# Patient Record
Sex: Male | Born: 1965 | Race: Black or African American | Hispanic: No | Marital: Married | Smoking: Current every day smoker
Health system: Southern US, Community
[De-identification: ages and names within clinical notes are randomized; demographics above are authoritative.]

## PROBLEM LIST (undated history)

## (undated) DIAGNOSIS — F329 Major depressive disorder, single episode, unspecified: Secondary | ICD-10-CM

## (undated) DIAGNOSIS — R413 Other amnesia: Secondary | ICD-10-CM

## (undated) DIAGNOSIS — N529 Male erectile dysfunction, unspecified: Secondary | ICD-10-CM

## (undated) DIAGNOSIS — M549 Dorsalgia, unspecified: Secondary | ICD-10-CM

## (undated) DIAGNOSIS — G8929 Other chronic pain: Secondary | ICD-10-CM

## (undated) DIAGNOSIS — R945 Abnormal results of liver function studies: Secondary | ICD-10-CM

## (undated) DIAGNOSIS — R7989 Other specified abnormal findings of blood chemistry: Secondary | ICD-10-CM

## (undated) DIAGNOSIS — M1710 Unilateral primary osteoarthritis, unspecified knee: Secondary | ICD-10-CM

## (undated) DIAGNOSIS — S060XAA Concussion with loss of consciousness status unknown, initial encounter: Secondary | ICD-10-CM

## (undated) DIAGNOSIS — S060X9A Concussion with loss of consciousness of unspecified duration, initial encounter: Secondary | ICD-10-CM

## (undated) HISTORY — DX: Other chronic pain: G89.29

## (undated) HISTORY — DX: Concussion with loss of consciousness of unspecified duration, initial encounter: S06.0X9A

## (undated) HISTORY — DX: Major depressive disorder, single episode, unspecified: F32.9

## (undated) HISTORY — DX: Other amnesia: R41.3

## (undated) HISTORY — DX: Concussion with loss of consciousness status unknown, initial encounter: S06.0XAA

## (undated) HISTORY — PX: OTHER SURGICAL HISTORY: SHX169

## (undated) HISTORY — DX: Other specified abnormal findings of blood chemistry: R79.89

## (undated) HISTORY — DX: Abnormal results of liver function studies: R94.5

## (undated) HISTORY — DX: Unilateral primary osteoarthritis, unspecified knee: M17.10

## (undated) HISTORY — DX: Male erectile dysfunction, unspecified: N52.9

## (undated) HISTORY — DX: Dorsalgia, unspecified: M54.9

---

## 2004-02-04 ENCOUNTER — Emergency Department (HOSPITAL_COMMUNITY): Admission: EM | Admit: 2004-02-04 | Discharge: 2004-02-04 | Payer: Self-pay | Admitting: Emergency Medicine

## 2009-04-16 ENCOUNTER — Emergency Department (HOSPITAL_COMMUNITY): Admission: EM | Admit: 2009-04-16 | Discharge: 2009-04-16 | Payer: Self-pay | Admitting: Emergency Medicine

## 2009-10-16 ENCOUNTER — Emergency Department (HOSPITAL_COMMUNITY): Admission: EM | Admit: 2009-10-16 | Discharge: 2009-10-16 | Payer: Self-pay | Admitting: Emergency Medicine

## 2011-01-23 ENCOUNTER — Observation Stay (HOSPITAL_COMMUNITY)
Admission: EM | Admit: 2011-01-23 | Discharge: 2011-01-24 | Disposition: A | Discharge status: Home / Self Care | Payer: Self-pay | Attending: Emergency Medicine | Admitting: Emergency Medicine

## 2011-01-23 DIAGNOSIS — Y998 Other external cause status: Secondary | ICD-10-CM | POA: Insufficient documentation

## 2011-01-23 DIAGNOSIS — L02419 Cutaneous abscess of limb, unspecified: Principal | ICD-10-CM | POA: Insufficient documentation

## 2011-01-23 DIAGNOSIS — IMO0002 Reserved for concepts with insufficient information to code with codable children: Secondary | ICD-10-CM | POA: Insufficient documentation

## 2011-01-23 LAB — CBC
MCH: 29.5 pg (ref 26.0–34.0)
MCHC: 35.6 g/dL (ref 30.0–36.0)
Platelets: 190 10*3/uL (ref 150–400)
RDW: 13.9 % (ref 11.5–15.5)

## 2011-01-23 LAB — DIFFERENTIAL
Basophils Relative: 0 % (ref 0–1)
Eosinophils Absolute: 0.1 10*3/uL (ref 0.0–0.7)
Monocytes Absolute: 0.8 10*3/uL (ref 0.1–1.0)
Monocytes Relative: 9 % (ref 3–12)

## 2011-01-24 LAB — BASIC METABOLIC PANEL
Calcium: 8.6 mg/dL (ref 8.4–10.5)
Creatinine, Ser: 1.08 mg/dL (ref 0.4–1.5)
GFR calc Af Amer: >60 mL/min (ref >60)
GFR calc non Af Amer: >60 mL/min (ref >60)

## 2011-06-17 ENCOUNTER — Emergency Department (HOSPITAL_COMMUNITY)
Admission: EM | Admit: 2011-06-17 | Discharge: 2011-06-17 | Disposition: A | Discharge status: Home / Self Care | Payer: Self-pay | Attending: Emergency Medicine | Admitting: Emergency Medicine

## 2011-06-17 DIAGNOSIS — L298 Other pruritus: Secondary | ICD-10-CM | POA: Insufficient documentation

## 2011-06-17 DIAGNOSIS — L2989 Other pruritus: Secondary | ICD-10-CM | POA: Insufficient documentation

## 2011-06-17 DIAGNOSIS — R21 Rash and other nonspecific skin eruption: Secondary | ICD-10-CM | POA: Insufficient documentation

## 2011-06-17 DIAGNOSIS — T622X1A Toxic effect of other ingested (parts of) plant(s), accidental (unintentional), initial encounter: Secondary | ICD-10-CM | POA: Insufficient documentation

## 2011-06-17 DIAGNOSIS — L255 Unspecified contact dermatitis due to plants, except food: Secondary | ICD-10-CM | POA: Insufficient documentation

## 2012-01-14 ENCOUNTER — Emergency Department (HOSPITAL_COMMUNITY): Payer: 59

## 2012-01-14 ENCOUNTER — Encounter (HOSPITAL_COMMUNITY): Payer: Self-pay | Admitting: *Deleted

## 2012-01-14 ENCOUNTER — Observation Stay (HOSPITAL_COMMUNITY)
Admission: EM | Admit: 2012-01-14 | Discharge: 2012-01-15 | Disposition: A | Discharge status: Home / Self Care | Payer: 59 | Attending: Emergency Medicine | Admitting: Emergency Medicine

## 2012-01-14 DIAGNOSIS — R0602 Shortness of breath: Secondary | ICD-10-CM | POA: Insufficient documentation

## 2012-01-14 DIAGNOSIS — F17200 Nicotine dependence, unspecified, uncomplicated: Secondary | ICD-10-CM | POA: Insufficient documentation

## 2012-01-14 DIAGNOSIS — R079 Chest pain, unspecified: Principal | ICD-10-CM | POA: Insufficient documentation

## 2012-01-14 LAB — COMPREHENSIVE METABOLIC PANEL
ALT: 29 U/L (ref 0–53)
AST: 26 U/L (ref 0–37)
Albumin: 3.8 g/dL (ref 3.5–5.2)
CO2: 25 mEq/L (ref 19–32)
Calcium: 9.3 mg/dL (ref 8.4–10.5)
Chloride: 105 mEq/L (ref 96–112)
Creatinine, Ser: 1.1 mg/dL (ref 0.50–1.35)
Sodium: 138 mEq/L (ref 135–145)

## 2012-01-14 LAB — DIFFERENTIAL
Band Neutrophils: 0 % (ref 0–10)
Basophils Absolute: 0 10*3/uL (ref 0.0–0.1)
Basophils Relative: 0 % (ref 0–1)
Blasts: 0 %
Eosinophils Absolute: 0.2 K/uL (ref 0.0–0.7)
Eosinophils Relative: 2 % (ref 0–5)
Lymphocytes Relative: 57 % — ABNORMAL HIGH (ref 12–46)
Lymphs Abs: 4.7 10*3/uL — ABNORMAL HIGH (ref 0.7–4.0)
Metamyelocytes Relative: 0 %
Monocytes Absolute: 0.3 10*3/uL (ref 0.1–1.0)
Monocytes Relative: 4 % (ref 3–12)
Myelocytes: 0 %
Neutro Abs: 3 K/uL (ref 1.7–7.7)
Neutrophils Relative %: 37 % — ABNORMAL LOW (ref 43–77)
Promyelocytes Absolute: 0 %
nRBC: 0 /100{WBCs}

## 2012-01-14 LAB — CK TOTAL AND CKMB (NOT AT ARMC)
CK, MB: 1.7 ng/mL (ref 0.3–4.0)
Relative Index: 0.9 (ref 0.0–2.5)
Total CK: 180 U/L (ref 7–232)

## 2012-01-14 LAB — CBC
HCT: 44.1 % (ref 39.0–52.0)
Hemoglobin: 15.6 g/dL (ref 13.0–17.0)
MCH: 29.5 pg (ref 26.0–34.0)
MCHC: 35.4 g/dL (ref 30.0–36.0)
MCV: 83.4 fL (ref 78.0–100.0)
Platelets: 213 10*3/uL (ref 150–400)
RBC: 5.29 MIL/uL (ref 4.22–5.81)
RDW: 14 % (ref 11.5–15.5)
WBC: 8.2 10*3/uL (ref 4.0–10.5)

## 2012-01-14 LAB — TROPONIN I: Troponin I: <0.3 ng/mL (ref <0.30)

## 2012-01-14 LAB — COMPREHENSIVE METABOLIC PANEL WITH GFR
Alkaline Phosphatase: 48 U/L (ref 39–117)
BUN: 12 mg/dL (ref 6–23)
GFR calc Af Amer: >90 mL/min (ref >90)
GFR calc non Af Amer: 79 mL/min — ABNORMAL LOW (ref >90)
Glucose, Bld: 86 mg/dL (ref 70–99)
Potassium: 4.1 meq/L (ref 3.5–5.1)
Total Bilirubin: 0.4 mg/dL (ref 0.3–1.2)
Total Protein: 7 g/dL (ref 6.0–8.3)

## 2012-01-14 MED ORDER — ASPIRIN 325 MG PO TABS
325.0000 mg | ORAL_TABLET | ORAL | Status: AC
  Administered 2012-01-14: 325 mg via ORAL
  Filled 2012-01-14: qty 1

## 2012-01-14 MED ORDER — ACETAMINOPHEN 500 MG PO TABS
500.0000 mg | ORAL_TABLET | Freq: Four times a day (QID) | ORAL | Status: DC | PRN
  Filled 2012-01-14: qty 1

## 2012-01-14 MED ORDER — SODIUM CHLORIDE 0.9 % IV SOLN
1000.0000 mL | INTRAVENOUS | Status: DC
  Administered 2012-01-14: 1000 mL via INTRAVENOUS

## 2012-01-14 MED ORDER — ONDANSETRON HCL 4 MG/2ML IJ SOLN: 4.0000 mg | Freq: Four times a day (QID) | INTRAMUSCULAR | Status: DC | PRN

## 2012-01-14 NOTE — ED Notes (Signed)
The pt has a pain in his chest and lt underarm for 2 weeks intermittently.  He describes it as a cramp.  He had a 10 hour car trip 2 weeks ago and he started having leg pain and swelling bi-laterally .  None at present

## 2012-01-14 NOTE — ED Notes (Addendum)
Patient states he was sitting at his desk at work and started to feel SOB and left side chest pain radiating to left shoulder and intermittent abdominal pain and dizziness. Patient states he has swelling bilateral lower extremities on daily basis. Patient placed on monitor and sats of 98% RA. Family at bedside.

## 2012-01-14 NOTE — ED Provider Notes (Signed)
History     CSN: [id number]  Arrival date & time 01/14/12  1639   First MD Initiated Contact with Patient 01/14/12 1728      Chief Complaint  Patient presents with  . Chest Pain    (Consider location/radiation/quality/duration/timing/severity/associated sxs/prior treatment) Patient is a 46 y.o. male presenting with chest pain. The history is provided by the patient.  Chest Pain The chest pain began 1 - 2 weeks ago. Chest pain occurs intermittently. The chest pain is resolved. Associated with: unknown. At its most intense, the pain is at 8/10. The pain is currently at 0/10. The severity of the pain is moderate. The quality of the pain is described as sharp. Radiates to: radiates to left neck and left arm. Primary symptoms include shortness of breath (mild). Pertinent negatives for primary symptoms include no fever, no cough, no abdominal pain, no nausea and no vomiting.  Pertinent negatives for associated symptoms include no numbness. He tried nothing for the symptoms. Risk factors: smoker.     History reviewed. No pertinent past medical history.  History reviewed. No pertinent past surgical history.  No family history on file.  History  Substance Use Topics  . Smoking status: Current Everyday Smoker  . Smokeless tobacco: Not on file  . Alcohol Use: Yes      Review of Systems  Constitutional: Negative for fever.  HENT: Negative for rhinorrhea, drooling and neck pain.   Eyes: Negative for pain.  Respiratory: Positive for shortness of breath (mild). Negative for cough.   Cardiovascular: Positive for chest pain. Negative for leg swelling.  Gastrointestinal: Negative for nausea, vomiting, abdominal pain and diarrhea.  Genitourinary: Negative for dysuria and hematuria.  Musculoskeletal: Negative for gait problem.  Skin: Negative for color change.  Neurological: Negative for numbness and headaches.  Hematological: Negative for adenopathy.  Psychiatric/Behavioral: Negative  for behavioral problems.  All other systems reviewed and are negative.    Allergies  Review of patient's allergies indicates no known allergies.  Home Medications  No current outpatient prescriptions on file.  BP 132/89  Pulse 58  Temp(Src) 98.3 F (36.8 C) (Oral)  Resp 17  SpO2 100%  Physical Exam  Constitutional: He is oriented to person, place, and time. He appears well-developed and well-nourished.  HENT:  Head: Normocephalic and atraumatic.  Right Ear: External ear normal.  Left Ear: External ear normal.  Nose: Nose normal.  Mouth/Throat: Oropharynx is clear and moist. No oropharyngeal exudate.  Eyes: Conjunctivae and EOM are normal. Pupils are equal, round, and reactive to light.  Neck: Normal range of motion. Neck supple.  Cardiovascular: Normal rate, regular rhythm, normal heart sounds and intact distal pulses.  Exam reveals no gallop and no friction rub.   No murmur heard. Pulmonary/Chest: Effort normal and breath sounds normal. No respiratory distress. He has no wheezes.  Abdominal: Soft. Bowel sounds are normal. He exhibits no distension. There is no tenderness.  Musculoskeletal: Normal range of motion. He exhibits no edema and no tenderness.  Neurological: He is alert and oriented to person, place, and time.  Skin: Skin is warm and dry.  Psychiatric: He has a normal mood and affect. His behavior is normal.    ED Course  Procedures (including critical care time)  Labs Reviewed  DIFFERENTIAL - Abnormal; Notable for the following:    Neutrophils Relative 37 (*)    Lymphocytes Relative 57 (*)    Lymphs Abs 4.7 (*)    All other components within normal limits  COMPREHENSIVE METABOLIC PANEL -  Abnormal; Notable for the following:    GFR calc non Af Amer 79 (*)    All other components within normal limits  CBC  CK TOTAL AND CKMB  TROPONIN I   Dg Chest 2 View  01/14/2012  *RADIOLOGY REPORT*  Clinical Data: Left-sided chest pain.  Dizziness.  CHEST - 2 VIEW   Comparison: None.  Findings: Cardiomediastinal silhouette unremarkable.   Lungs clear. Bronchovascular markings normal.  Pulmonary vascularity normal.  No pneumothorax.  No pleural effusions.  Mild degenerative changes involving the thoracic spine.  IMPRESSION: No acute cardiopulmonary disease.  Original Report Authenticated By: Arnell Sieving, M.D.     1. Chest pain      Date: 01/14/2012  Rate: 62  Rhythm: normal sinus rhythm  QRS Axis: normal  Intervals: normal  ST/T Wave abnormalities: nonspecific T wave changes  Conduction Disutrbances:none  Narrative Interpretation: Flipped T waves in III and aVF  Old EKG Reviewed: none available    MDM  7:14 PM 46 y.o. male pw intermittent cp x 2 weeks, worse today. Pt notes left sided cp while at rest w/ assoc sx of mild sob and dizziness today. Pt AFVSS here, appears well on exam, denies cp now. Pt is timi 0. Labs non-contrib. Pt got ASA.  Will admit to chest pain obs and get coronary CT in the am.   Clinical Impression 1. Chest pain            Purvis Sheffield, MD 01/15/12 0003

## 2012-01-15 ENCOUNTER — Observation Stay (HOSPITAL_COMMUNITY)
Admit: 2012-01-15 | Discharge: 2012-01-15 | Disposition: A | Discharge status: Home / Self Care | Payer: 59 | Attending: Emergency Medicine | Admitting: Emergency Medicine

## 2012-01-15 LAB — POCT I-STAT TROPONIN I
Troponin i, poc: 0 ng/mL (ref 0.00–0.08)
Troponin i, poc: 0 ng/mL (ref 0.00–0.08)
Troponin i, poc: 0 ng/mL (ref 0.00–0.08)

## 2012-01-15 MED ORDER — NITROGLYCERIN 0.4 MG SL SUBL
SUBLINGUAL_TABLET | SUBLINGUAL | Status: AC
  Filled 2012-01-15: qty 25

## 2012-01-15 MED ORDER — METOPROLOL TARTRATE 25 MG PO TABS
50.0000 mg | ORAL_TABLET | Freq: Once | ORAL | Status: AC
  Administered 2012-01-15: 50 mg via ORAL
  Filled 2012-01-15: qty 2

## 2012-01-15 MED ORDER — METOPROLOL TARTRATE 1 MG/ML IV SOLN
5.0000 mg | Freq: Once | INTRAVENOUS | Status: AC
  Administered 2012-01-15: 5 mg via INTRAVENOUS

## 2012-01-15 MED ORDER — METOPROLOL TARTRATE 1 MG/ML IV SOLN
5.0000 mg | Freq: Once | INTRAVENOUS | Status: AC
  Administered 2012-01-15: 5 mg via INTRAVENOUS
  Filled 2012-01-15: qty 15

## 2012-01-15 MED ORDER — NITROGLYCERIN 0.4 MG SL SUBL
0.4000 mg | SUBLINGUAL_TABLET | Freq: Once | SUBLINGUAL | Status: AC
  Administered 2012-01-15: 0.4 mg via SUBLINGUAL

## 2012-01-15 MED ORDER — IOHEXOL 350 MG/ML SOLN
80.0000 mL | Freq: Once | INTRAVENOUS | Status: AC | PRN
  Administered 2012-01-15: 80 mL via INTRAVENOUS

## 2012-01-15 NOTE — ED Notes (Signed)
Breakfast tray to pt, he has discharge papers but wants to eat before going home

## 2012-01-15 NOTE — ED Provider Notes (Signed)
I saw and evaluated the patient, reviewed the resident's note and I agree with the findings and plan.   I agree with his ECG interpretation.  Pt with intermittent chest pressure, pt is smoker.  No pain now.  Will get CDU coronary CT scan.  Pt with non specific T wave inversions inferiorly.    Gavin Pound. Jalynn Betzold, MD 01/15/12 0005

## 2012-01-15 NOTE — ED Notes (Signed)
Dr Judd Lien in to speak with pt about results.

## 2012-01-15 NOTE — ED Notes (Signed)
BMI: 32.6 

## 2012-01-15 NOTE — ED Notes (Signed)
Pt given grape juice 

## 2012-01-15 NOTE — ED Provider Notes (Signed)
Care assumed at shift change.  The coronary ct is okay.  Will discharge the patient to home.  To follow up with pcp in the next week, return prn.  Geoffery Lyons, MD 01/15/12 1031

## 2012-01-15 NOTE — Discharge Instructions (Signed)
Chest Pain (Nonspecific) It is often hard to give a specific diagnosis for the cause of chest pain. There is always a chance that your pain could be related to something serious, such as a heart attack or a blood clot in the lungs. You need to follow up with your caregiver for further evaluation. CAUSES   Heartburn.   Pneumonia or bronchitis.   Anxiety or stress.   Inflammation around your heart (pericarditis) or lung (pleuritis or pleurisy).   A blood clot in the lung.   A collapsed lung (pneumothorax). It can develop suddenly on its own (spontaneous pneumothorax) or from injury (trauma) to the chest.   Shingles infection (herpes zoster virus).  The chest wall is composed of bones, muscles, and cartilage. Any of these can be the source of the pain.  The bones can be bruised by injury.   The muscles or cartilage can be strained by coughing or overwork.   The cartilage can be affected by inflammation and become sore (costochondritis).  DIAGNOSIS  Lab tests or other studies, such as X-rays, electrocardiography, stress testing, or cardiac imaging, may be needed to find the cause of your pain.  TREATMENT   Treatment depends on what may be causing your chest pain. Treatment may include:   Acid blockers for heartburn.   Anti-inflammatory medicine.   Pain medicine for inflammatory conditions.   Antibiotics if an infection is present.   You may be advised to change lifestyle habits. This includes stopping smoking and avoiding alcohol, caffeine, and chocolate.   You may be advised to keep your head raised (elevated) when sleeping. This reduces the chance of acid going backward from your stomach into your esophagus.   Most of the time, nonspecific chest pain will improve within 2 to 3 days with rest and mild pain medicine.  HOME CARE INSTRUCTIONS   If antibiotics were prescribed, take your antibiotics as directed. Finish them even if you start to feel better.   For the next few  days, avoid physical activities that bring on chest pain. Continue physical activities as directed.   Do not smoke.   Avoid drinking alcohol.   Only take over-the-counter or prescription medicine for pain, discomfort, or fever as directed by your caregiver.   Follow your caregiver's suggestions for further testing if your chest pain does not go away.   Keep any follow-up appointments you made. If you do not go to an appointment, you could develop lasting (chronic) problems with pain. If there is any problem keeping an appointment, you must call to reschedule.  SEEK MEDICAL CARE IF:   You think you are having problems from the medicine you are taking. Read your medicine instructions carefully.   Your chest pain does not go away, even after treatment.   You develop a rash with blisters on your chest.  SEEK IMMEDIATE MEDICAL CARE IF:   You have increased chest pain or pain that spreads to your arm, neck, jaw, back, or abdomen.   You develop shortness of breath, an increasing cough, or you are coughing up blood.   You have severe back or abdominal pain, feel nauseous, or vomit.   You develop severe weakness, fainting, or chills.   You have a fever.  THIS IS AN EMERGENCY. Do not wait to see if the pain will go away. Get medical help at once. Call your local emergency services (911 in [place] Do not drive yourself to the hospital. MAKE SURE YOU:   Understand these instructions.     Will watch your condition.   Will get help right away if you are not doing well or get worse.  Document Released: 05/20/2005 Document Revised: 07/30/2011 Document Reviewed: 03/15/2008 Exit[employer] Patient Information 2012 [employer]. 

## 2012-02-12 ENCOUNTER — Encounter (HOSPITAL_COMMUNITY): Payer: Self-pay | Admitting: *Deleted

## 2012-02-12 ENCOUNTER — Emergency Department (HOSPITAL_COMMUNITY)
Admission: EM | Admit: 2012-02-12 | Discharge: 2012-02-12 | Disposition: A | Discharge status: Home / Self Care | Payer: 59 | Attending: Emergency Medicine | Admitting: Emergency Medicine

## 2012-02-12 DIAGNOSIS — R55 Syncope and collapse: Secondary | ICD-10-CM | POA: Insufficient documentation

## 2012-02-12 DIAGNOSIS — R42 Dizziness and giddiness: Secondary | ICD-10-CM | POA: Insufficient documentation

## 2012-02-12 LAB — DIFFERENTIAL
Basophils Relative: 1 % (ref 0–1)
Eosinophils Absolute: 0.1 10*3/uL (ref 0.0–0.7)
Eosinophils Relative: 1 % (ref 0–5)
Neutrophils Relative %: 43 % (ref 43–77)

## 2012-02-12 LAB — COMPREHENSIVE METABOLIC PANEL
ALT: 32 U/L (ref 0–53)
AST: 25 U/L (ref 0–37)
Albumin: 3.8 g/dL (ref 3.5–5.2)
Alkaline Phosphatase: 54 U/L (ref 39–117)
BUN: 13 mg/dL (ref 6–23)
CO2: 27 mEq/L (ref 19–32)
Calcium: 9.4 mg/dL (ref 8.4–10.5)
Chloride: 102 mEq/L (ref 96–112)
Creatinine, Ser: 1.19 mg/dL (ref 0.50–1.35)
GFR calc Af Amer: 83 mL/min — ABNORMAL LOW (ref >90)
GFR calc non Af Amer: 72 mL/min — ABNORMAL LOW (ref >90)
Glucose, Bld: 113 mg/dL — ABNORMAL HIGH (ref 70–99)
Potassium: 4.1 mEq/L (ref 3.5–5.1)
Sodium: 137 mEq/L (ref 135–145)
Total Bilirubin: 0.5 mg/dL (ref 0.3–1.2)
Total Protein: 7.1 g/dL (ref 6.0–8.3)

## 2012-02-12 LAB — CBC
HCT: 43.5 % (ref 39.0–52.0)
Hemoglobin: 15.2 g/dL (ref 13.0–17.0)
MCH: 29.2 pg (ref 26.0–34.0)
MCHC: 34.9 g/dL (ref 30.0–36.0)
MCV: 83.7 fL (ref 78.0–100.0)
Platelets: 181 10*3/uL (ref 150–400)
RBC: 5.2 MIL/uL (ref 4.22–5.81)
RDW: 13.6 % (ref 11.5–15.5)
WBC: 7 10*3/uL (ref 4.0–10.5)

## 2012-02-12 MED ORDER — HYDROCODONE-ACETAMINOPHEN 5-500 MG PO TABS: 1.0000 | ORAL_TABLET | Freq: Four times a day (QID) | ORAL | Status: AC | PRN

## 2012-02-12 MED ORDER — SODIUM CHLORIDE 0.9 % IV SOLN
Freq: Once | INTRAVENOUS | Status: AC
  Administered 2012-02-12: 13:00:00 via INTRAVENOUS

## 2012-02-12 MED ORDER — MECLIZINE HCL 25 MG PO TABS: 25.0000 mg | ORAL_TABLET | Freq: Three times a day (TID) | ORAL | Status: AC | PRN

## 2012-02-12 MED ORDER — HYDROCODONE-ACETAMINOPHEN 5-325 MG PO TABS
1.0000 | ORAL_TABLET | Freq: Once | ORAL | Status: AC
  Administered 2012-02-12: 1 via ORAL
  Filled 2012-02-12: qty 1

## 2012-02-12 NOTE — Discharge Instructions (Signed)

## 2012-02-12 NOTE — ED Notes (Signed)
AVW:[id number]<XB> Expected date:<BR> Expected time:11:43 AM<BR> Means of arrival:<BR> Comments:<BR> M65 - 45yoM Near syncope, SOB

## 2012-02-12 NOTE — ED Notes (Signed)
Per EMS while at work pt started feeling dizzy, stood up and felt like he was about to pass out. Pt was diaphoretic on EMS arrival. Pt has hx of same and was evaluated at [facility] for same and told troponin was elevated. Pt denies complaints of pain.

## 2012-02-12 NOTE — ED Provider Notes (Addendum)
History     CSN: [id number]  Arrival date & time 02/12/12  1140   First MD Initiated Contact with Patient 02/12/12 1149      Chief Complaint  Patient presents with  . Near Syncope    (Consider location/radiation/quality/duration/timing/severity/associated sxs/prior treatment) HPI Comments: Was at work at [employer].  Bent over and stood up, then became dizzy and felt like was going to pass out.  Episode lasted for about 15 minutes, now seems to be improving.  Was recently on CDU at [facility] and had a normal cardiac ct.  Patient is a 46 y.o. male presenting with weakness. The history is provided by the patient.  Weakness The primary symptoms include dizziness. Primary symptoms do not include headaches. The symptoms began less than 1 hour ago. The episode lasted 15 minutes. The symptoms are improving. The neurological symptoms are diffuse. The symptoms occurred after standing up.  Dizziness also occurs with weakness.  Additional symptoms include weakness. Additional symptoms do not include pain. Medical issues do not include seizures or cerebral vascular accident. Procedure history comments: cardiac ct negative.    History reviewed. No pertinent past medical history.  History reviewed. No pertinent past surgical history.  History reviewed. No pertinent family history.  History  Substance Use Topics  . Smoking status: Current Everyday Smoker  . Smokeless tobacco: Not on file  . Alcohol Use: Yes      Review of Systems  Neurological: Positive for dizziness and weakness. Negative for headaches.    Allergies  Review of patient's allergies indicates no known allergies.  Home Medications   Current Outpatient Rx  Name Route Sig Dispense Refill  . HYDROCODONE-ACETAMINOPHEN 5-500 MG PO TABS Oral Take 2 tablets by mouth every 6 (six) hours as needed. Pain      BP 127/93  Pulse 57  Temp 98.6 F (37 C) (Oral)  Resp 12  SpO2 98%  Physical Exam  Nursing note and vitals  reviewed. Constitutional: He is oriented to person, place, and time. He appears well-developed and well-nourished. No distress.  HENT:  Head: Normocephalic and atraumatic.  Mouth/Throat: Oropharynx is clear and moist.  Eyes: EOM are normal. Pupils are equal, round, and reactive to light.  Neck: Normal range of motion. Neck supple.  Cardiovascular: Normal rate and regular rhythm.   No murmur heard. Pulmonary/Chest: Breath sounds normal. No respiratory distress. He has no wheezes.  Abdominal: Soft. Bowel sounds are normal. He exhibits no distension. There is no tenderness.  Musculoskeletal: Normal range of motion. He exhibits no edema.  Lymphadenopathy:    He has no cervical adenopathy.  Neurological: He is alert and oriented to person, place, and time. No cranial nerve deficit. He exhibits normal muscle tone. Coordination normal.  Skin: Skin is warm and dry. He is not diaphoretic.    ED Course  Procedures (including critical care time)  Labs Reviewed - No data to display No results found.   No diagnosis found.   Date: 02/12/2012  Rate: 63  Rhythm: normal sinus rhythm  QRS Axis: normal  Intervals: normal  ST/T Wave abnormalities: normal  Conduction Disutrbances:none  Narrative Interpretation:   Old EKG Reviewed: unchanged    MDM  The patient presents with dizziness that began at work after bending over, then standing up.  It has since resolved and he is now asymptomatic.  The workup today looks okay and cardiac ct last month makes a cardiac etiology unlikely.  He will be discharged to home.  Geoffery Lyons, MD 02/12/12 1448  Geoffery Lyons, MD 03/09/12 1610

## 2012-05-22 ENCOUNTER — Encounter: Payer: Self-pay | Admitting: Internal Medicine

## 2012-05-22 DIAGNOSIS — Z0000 Encounter for general adult medical examination without abnormal findings: Secondary | ICD-10-CM | POA: Insufficient documentation

## 2012-05-24 ENCOUNTER — Ambulatory Visit (INDEPENDENT_AMBULATORY_CARE_PROVIDER_SITE_OTHER): Payer: 59 | Admitting: Internal Medicine

## 2012-05-24 ENCOUNTER — Other Ambulatory Visit (INDEPENDENT_AMBULATORY_CARE_PROVIDER_SITE_OTHER): Payer: 59

## 2012-05-24 ENCOUNTER — Encounter: Payer: Self-pay | Admitting: Internal Medicine

## 2012-05-24 VITALS — BP 110/70 | HR 76 | Temp 98.5°F | Ht 76.0 in | Wt 272.5 lb

## 2012-05-24 DIAGNOSIS — F32A Depression, unspecified: Secondary | ICD-10-CM

## 2012-05-24 DIAGNOSIS — Z0000 Encounter for general adult medical examination without abnormal findings: Secondary | ICD-10-CM

## 2012-05-24 DIAGNOSIS — M179 Osteoarthritis of knee, unspecified: Secondary | ICD-10-CM

## 2012-05-24 DIAGNOSIS — H538 Other visual disturbances: Secondary | ICD-10-CM

## 2012-05-24 DIAGNOSIS — F329 Major depressive disorder, single episode, unspecified: Secondary | ICD-10-CM

## 2012-05-24 DIAGNOSIS — Z23 Encounter for immunization: Secondary | ICD-10-CM

## 2012-05-24 DIAGNOSIS — M1710 Unilateral primary osteoarthritis, unspecified knee: Secondary | ICD-10-CM

## 2012-05-24 DIAGNOSIS — N529 Male erectile dysfunction, unspecified: Secondary | ICD-10-CM

## 2012-05-24 HISTORY — DX: Osteoarthritis of knee, unspecified: M17.9

## 2012-05-24 HISTORY — DX: Depression, unspecified: F32.A

## 2012-05-24 HISTORY — DX: Unilateral primary osteoarthritis, unspecified knee: M17.10

## 2012-05-24 HISTORY — DX: Male erectile dysfunction, unspecified: N52.9

## 2012-05-24 LAB — BASIC METABOLIC PANEL
Calcium: 9 mg/dL (ref 8.4–10.5)
Creatinine, Ser: 1.2 mg/dL (ref 0.4–1.5)
GFR: 87.76 mL/min (ref >60.00)
Glucose, Bld: 92 mg/dL (ref 70–99)
Sodium: 138 mEq/L (ref 135–145)

## 2012-05-24 LAB — HEPATIC FUNCTION PANEL
Albumin: 3.7 g/dL (ref 3.5–5.2)
Alkaline Phosphatase: 48 U/L (ref 39–117)
Bilirubin, Direct: 0.1 mg/dL (ref 0.0–0.3)
Total Protein: 6.5 g/dL (ref 6.0–8.3)

## 2012-05-24 LAB — CBC WITH DIFFERENTIAL/PLATELET
Basophils Absolute: 0 10*3/uL (ref 0.0–0.1)
Basophils Relative: 0.3 % (ref 0.0–3.0)
HCT: 48.2 % (ref 39.0–52.0)
Hemoglobin: 15.9 g/dL (ref 13.0–17.0)
Lymphocytes Relative: 42.4 % (ref 12.0–46.0)
Lymphs Abs: 3 10*3/uL (ref 0.7–4.0)
Monocytes Relative: 8.4 % (ref 3.0–12.0)
Neutro Abs: 3.4 10*3/uL (ref 1.4–7.7)
RBC: 5.51 Mil/uL (ref 4.22–5.81)
RDW: 14.2 % (ref 11.5–14.6)

## 2012-05-24 LAB — PSA: PSA: 0.53 ng/mL (ref 0.10–4.00)

## 2012-05-24 LAB — URINALYSIS, ROUTINE W REFLEX MICROSCOPIC
Bilirubin Urine: NEGATIVE
Hgb urine dipstick: NEGATIVE
Ketones, ur: NEGATIVE
Nitrite: NEGATIVE
Total Protein, Urine: NEGATIVE
Urine Glucose: NEGATIVE
pH: 6 (ref 5.0–8.0)

## 2012-05-24 LAB — LDL CHOLESTEROL, DIRECT: Direct LDL: 121.7 mg/dL

## 2012-05-24 LAB — LIPID PANEL
Cholesterol: 200 mg/dL (ref 0–200)
HDL: 34.2 mg/dL — ABNORMAL LOW (ref >39.00)
Total CHOL/HDL Ratio: 6
Triglycerides: 249 mg/dL — ABNORMAL HIGH (ref 0.0–149.0)

## 2012-05-24 MED ORDER — DULOXETINE HCL 60 MG PO CPEP: 60.0000 mg | ORAL_CAPSULE | Freq: Every day | ORAL | Status: DC

## 2012-05-24 MED ORDER — HYDROCODONE-ACETAMINOPHEN 7.5-500 MG PO TABS: 1.0000 | ORAL_TABLET | Freq: Four times a day (QID) | ORAL | Status: DC | PRN

## 2012-05-24 MED ORDER — SILDENAFIL CITRATE 100 MG PO TABS: 100.0000 mg | ORAL_TABLET | Freq: Every day | ORAL | Status: DC | PRN

## 2012-05-24 MED ORDER — CYCLOBENZAPRINE HCL 5 MG PO TABS: 5.0000 mg | ORAL_TABLET | Freq: Three times a day (TID) | ORAL | Status: AC | PRN

## 2012-05-24 NOTE — Assessment & Plan Note (Addendum)
bilat knees, chronic pain, bone on bone per pt left > right, on pain med for 10 yrs, pt has been considering surgury for 4 yrs and now would like referral

## 2012-05-24 NOTE — Assessment & Plan Note (Signed)
Ok for viagra asd prn,  to f/u any worsening symptoms or concerns , for testosterone level

## 2012-05-24 NOTE — Patient Instructions (Addendum)
You had the flu shot today Your EKG was ok today Please go to LAB in the Basement for the blood and/or urine tests to be done today You will be contacted by phone if any changes need to be made immediately.  Otherwise, you will receive a letter about your results with an explanation. Please remember to sign up for My Chart at your earliest convenience, as this will be important to you in the future with finding out test results. Take all new medications as prescribed - the Cymbalta for depression and pain (it is started at 30 mg per day for 1 wk, then 60 mg per day after that); as well as the increased strength of the pain medication, and the muscle relaxer as needed Continue all other medications as before - including the viagra (with the coupon today) You will be contacted regarding the referral for: orthopedic, and opthomology

## 2012-05-24 NOTE — Assessment & Plan Note (Signed)
With ? Retinal problem previously - for optho referral

## 2012-05-24 NOTE — Assessment & Plan Note (Signed)

## 2012-05-24 NOTE — Progress Notes (Signed)
Subjective:    Patient ID: Ryan Zimmerman, male    DOB: 17-Dec-1965, 46 y.o.   MRN: [id number]  HPI  Here for wellness and f/u;  Overall doing ok;  Pt denies CP, worsening SOB, DOE, wheezing, orthopnea, PND, worsening LE edema, palpitations, dizziness or syncope.  Pt denies neurological change such as new Headache, facial or extremity weakness.  Pt denies polydipsia, polyuria, or low sugar symptoms. Pt states overall good compliance with treatment and medications, good tolerability, and trying to follow lower cholesterol diet. No fever, wt loss, night sweats, loss of appetite, or other constitutional symptoms.  Pt states good ability with ADL's, low fall risk, home safety reviewed and adequate, no significant changes in hearing or vision, and occasionally active with exercise.  Has end stage OA knees per pt, pain now too severe and would like referral to ortho.  Also occasional mid and upper abd discomfort/uncomfortable, mild to mod, 3 yrs, no n/v except with some certain foods and no vomit except for rare gag.  Denies constipatoin and  Denies urinary symptoms such as dysuria, frequency, urgency,or hematuria.   Pt denies fever, wt loss, night sweats, loss of appetite, or other constitutional symptoms . Has had some worsening ED issue in past 1 yr , wife concerned, viagra helped last yr.  Has been more depressed with pain , less active, less social lately, Denies suicidal ideation, or panic.   He would try sample cymbalta if helps with pain, as wife has been pushing for him to try something and he has been reluctant to date. Past Medical History  Diagnosis Date  . Osteoarthritis of knee 05/24/2012  . Depression 05/24/2012  . Erectile dysfunction 05/24/2012   History reviewed. No pertinent past surgical history.  reports that he has quit smoking. He has never used smokeless tobacco. He reports that he drinks alcohol. He reports that he does not use illicit drugs. family history includes Arthritis in his other;  Cancer in his mother; and Hypertension in his other. Allergies  Allergen Reactions  . Percocet (Oxycodone-Acetaminophen) Itching   No current outpatient prescriptions on file prior to visit.   Review of Systems Review of Systems  Constitutional: Negative for diaphoresis, activity change, appetite change and unexpected weight change.  HENT: Negative for hearing loss, ear pain, facial swelling, mouth sores and neck stiffness.  but has recent worsening blurry vision, remembers a concern yrs ago about what sounds like retinal swelling related issue;  Also reading glasses not working as well for [employer] he does every day Eyes: Negative for pain, redness and visual disturbance.  Respiratory: Negative for shortness of breath and wheezing.   Cardiovascular: Negative for chest pain and palpitations.  Gastrointestinal: Negative for diarrhea, blood in stool, abdominal distention and rectal pain.  Genitourinary: Negative for hematuria, flank pain and decreased urine volume.  Musculoskeletal: Negative for myalgias except for recurring pullled muslce right flank area Skin: Negative for color change and wound.  Neurological: Negative for syncope and numbness.  Hematological: Negative for adenopathy.  Psychiatric/Behavioral: Negative for hallucinations, self-injury, decreased concentration and agitation.     Objective:   Physical Exam BP 110/70  Pulse 76  Temp 98.5 F (36.9 C) (Oral)  Ht 6\' 4"  (1.93 m)  Wt 272 lb 8 oz (123.605 kg)  BMI 33.17 kg/m2  SpO2 97% Physical Exam  VS noted Constitutional: Pt is oriented to person, place, and time. Appears well-developed and well-nourished.  HENT:  Head: Normocephalic and atraumatic.  Right Ear: External ear normal.  Left Ear: External ear normal.  Nose: Nose normal.  Mouth/Throat: Oropharynx is clear and moist.  Eyes: Conjunctivae and EOM are normal. Pupils are equal, round, and reactive to light.  Neck: Normal range of motion. Neck supple.  No JVD present. No tracheal deviation present.  Cardiovascular: Normal rate, regular rhythm, normal heart sounds and intact distal pulses.   Pulmonary/Chest: Effort normal and breath sounds normal.  Abdominal: Soft. Bowel sounds are normal. There is no tenderness.  Musculoskeletal: Normal range of motion. Exhibits no edema.  Lymphadenopathy:  Has no cervical adenopathy.  Bilat knees with crepitus left > right, with small left effusion, neg lockmans Neurological: Pt is alert and oriented to person, place, and time. Pt has normal reflexes. No cranial nerve deficit.  Skin: Skin is warm and dry. No rash noted.  Psychiatric:  Has  Depressed mood and affect. Behavior is normal.     Assessment & Plan:

## 2012-05-24 NOTE — Assessment & Plan Note (Addendum)
In the setting of chronic pain, gave 1 wk cymbalta sample 30 mg, and 2 wks of the 60 mg per day after that, then rx as well to continue, delcines counseling/psychiatry, verified nonsuicidal

## 2012-05-25 ENCOUNTER — Encounter: Payer: Self-pay | Admitting: Internal Medicine

## 2012-05-25 LAB — TESTOSTERONE, FREE, TOTAL, SHBG
Sex Hormone Binding: 27 nmol/L (ref 13–71)
Testosterone, Free: 77.2 pg/mL (ref 47.0–244.0)
Testosterone-% Free: 2.2 % (ref 1.6–2.9)
Testosterone: 345.52 ng/dL (ref 300–890)

## 2012-07-05 ENCOUNTER — Ambulatory Visit (INDEPENDENT_AMBULATORY_CARE_PROVIDER_SITE_OTHER): Payer: 59 | Admitting: Internal Medicine

## 2012-07-05 ENCOUNTER — Encounter: Payer: Self-pay | Admitting: Internal Medicine

## 2012-07-05 VITALS — BP 108/72 | HR 67 | Temp 99.5°F | Ht 76.0 in | Wt 267.5 lb

## 2012-07-05 DIAGNOSIS — F32A Depression, unspecified: Secondary | ICD-10-CM

## 2012-07-05 DIAGNOSIS — M1710 Unilateral primary osteoarthritis, unspecified knee: Secondary | ICD-10-CM

## 2012-07-05 DIAGNOSIS — G8929 Other chronic pain: Secondary | ICD-10-CM

## 2012-07-05 DIAGNOSIS — F329 Major depressive disorder, single episode, unspecified: Secondary | ICD-10-CM

## 2012-07-05 DIAGNOSIS — M179 Osteoarthritis of knee, unspecified: Secondary | ICD-10-CM

## 2012-07-05 MED ORDER — OXYCODONE-ACETAMINOPHEN 10-325 MG PO TABS: 1.0000 | ORAL_TABLET | Freq: Two times a day (BID) | ORAL | Status: DC | PRN

## 2012-07-05 NOTE — Assessment & Plan Note (Signed)
Needs repeat ortho referral, will do

## 2012-07-05 NOTE — Progress Notes (Signed)
  Subjective:    Patient ID: Ryan Zimmerman, male    DOB: 1966-08-22, 46 y.o.   MRN: [id number]  HPI  Here to f/u; unfortunately had itching with hdyrocodone so could not take.  Pain overall still uncontrolled, for some reason has not heard from [place] about a referral to orthopedic, has had some home phone issues.  Did try the cymbalta, but did not like how it made him feel in the first wk so stopped.  Pt denies chest pain, increased sob or doe, wheezing, orthopnea, PND, increased LE swelling, palpitations, dizziness or syncope.  Pt denies new neurological symptoms such as new headache, or facial or extremity weakness or numbness   Pt denies polydipsia, polyuria, Pt states overall good compliance with meds, trying to follow lower cholesterol, diabetic diet, wt overall stable but little exercise however. Past Medical History  Diagnosis Date  . Osteoarthritis of knee 05/24/2012  . Depression 05/24/2012  . Erectile dysfunction 05/24/2012   No past surgical history on file.  reports that he has quit smoking. He has never used smokeless tobacco. He reports that he drinks alcohol. He reports that he does not use illicit drugs. family history includes Arthritis in his other; Cancer in his mother; and Hypertension in his other. Allergies  Allergen Reactions  . Hydrocodone     itching   Current Outpatient Prescriptions on File Prior to Visit  Medication Sig Dispense Refill  . sildenafil (VIAGRA) 100 MG tablet Take 1 tablet (100 mg total) by mouth daily as needed for erectile dysfunction.  3 tablet  0  . cyclobenzaprine (FLEXERIL) 5 MG tablet Take 1 tablet (5 mg total) by mouth 3 (three) times daily as needed for muscle spasms.  60 tablet  2   Review of Systems  Constitutional: Negative for diaphoresis and unexpected weight change.  HENT: Negative for tinnitus.   Eyes: Negative for photophobia and visual disturbance.  Respiratory: Negative for choking and stridor.   Gastrointestinal: Negative for vomiting  and blood in stool.  Genitourinary: Negative for hematuria and decreased urine volume.  Musculoskeletal: Negative for gait problem.  Skin: Negative for color change and wound.  Neurological: Negative for tremors and numbness.  Psychiatric/Behavioral: Negative for decreased concentration. The patient is not hyperactive.       Objective:   Physical Exam BP 108/72  Pulse 67  Temp 99.5 F (37.5 C) (Oral)  Ht 6\' 4"  (1.93 m)  Wt 267 lb 8 oz (121.337 kg)  BMI 32.56 kg/m2  SpO2 96% Physical Exam  VS noted Constitutional: Pt appears well-developed and well-nourished.  HENT: Head: Normocephalic.  Right Ear: External ear normal.  Left Ear: External ear normal.  Eyes: Conjunctivae and EOM are normal. Pupils are equal, round, and reactive to light.  Neck: Normal range of motion. Neck supple.  Cardiovascular: Normal rate and regular rhythm.   Pulmonary/Chest: Effort normal and breath sounds normal.  Abd:  Soft, NT, non-distended, + BS Neurological: Pt is alert. Not confused  Skin: Skin is warm. No erythema.  Knee with marked OA bony change Psychiatric: Pt behavior is normal. Thought content normal. + mild depressed affect    Assessment & Plan:

## 2012-07-05 NOTE — Assessment & Plan Note (Signed)
Needs better control, for oxycodone prn asd,  to f/u any worsening symptoms or concerns

## 2012-07-05 NOTE — Assessment & Plan Note (Signed)
Mild persistent, verified nonsuicidal, decliens further trial tx such as lexapro 10

## 2012-07-05 NOTE — Patient Instructions (Addendum)
OK to stop the hydrocodone as you have Take all new medications as prescribed Continue all other medications as before Please have the pharmacy call with any refills you may need. Please call if you change your mind about further medication for nerves and depression such as lexapro Please return in 6 months, or sooner if needed

## 2012-07-06 ENCOUNTER — Encounter: Payer: Self-pay | Admitting: Internal Medicine

## 2012-09-06 ENCOUNTER — Ambulatory Visit: Payer: 59 | Admitting: Internal Medicine

## 2012-12-15 ENCOUNTER — Emergency Department (HOSPITAL_COMMUNITY)
Admission: EM | Admit: 2012-12-15 | Discharge: 2012-12-15 | Disposition: A | Discharge status: Home / Self Care | Payer: BC Managed Care – PPO | Source: Home / Self Care

## 2012-12-15 ENCOUNTER — Encounter (HOSPITAL_COMMUNITY): Payer: Self-pay | Admitting: *Deleted

## 2012-12-15 DIAGNOSIS — M722 Plantar fascial fibromatosis: Secondary | ICD-10-CM

## 2012-12-15 MED ORDER — KETOROLAC TROMETHAMINE 60 MG/2ML IM SOLN
60.0000 mg | Freq: Once | INTRAMUSCULAR | Status: AC
  Administered 2012-12-15: 60 mg via INTRAMUSCULAR

## 2012-12-15 MED ORDER — KETOROLAC TROMETHAMINE 60 MG/2ML IM SOLN
INTRAMUSCULAR | Status: AC
  Filled 2012-12-15: qty 2

## 2012-12-15 MED ORDER — MELOXICAM 7.5 MG PO TABS: 7.5000 mg | ORAL_TABLET | Freq: Every day | ORAL | Status: DC

## 2012-12-15 NOTE — ED Provider Notes (Signed)
History     CSN: [id number]  Arrival date & time 12/15/12  4098   First MD Initiated Contact with Patient 12/15/12 1835      Chief Complaint  Patient presents with  . Foot Pain    (Consider location/radiation/quality/duration/timing/severity/associated sxs/prior treatment) HPI  Past Medical History  Diagnosis Date  . Osteoarthritis of knee 05/24/2012  . Depression 05/24/2012  . Erectile dysfunction 05/24/2012    History reviewed. No pertinent past surgical history.  Family History  Problem Relation Age of Onset  . Cancer Mother     breast cancer  . Arthritis Other   . Hypertension Other     History  Substance Use Topics  . Smoking status: Former Games developer  . Smokeless tobacco: Never Used  . Alcohol Use: Yes     Comment: social     Review of Systems  Allergies  Hydrocodone  Home Medications   Current Outpatient Rx  Name  Route  Sig  Dispense  Refill  . cyclobenzaprine (FLEXERIL) 5 MG tablet   Oral   Take 1 tablet (5 mg total) by mouth 3 (three) times daily as needed for muscle spasms.   60 tablet   2   . meloxicam (MOBIC) 7.5 MG tablet   Oral   Take 1 tablet (7.5 mg total) by mouth daily.   30 tablet   0   . oxyCODONE-acetaminophen (PERCOCET) 10-325 MG per tablet   Oral   Take 1 tablet by mouth 2 (two) times daily as needed for pain.   60 tablet   0   . sildenafil (VIAGRA) 100 MG tablet   Oral   Take 1 tablet (100 mg total) by mouth daily as needed for erectile dysfunction.   3 tablet   0     BP 116/82  Pulse 78  Temp(Src) 98.6 F (37 C) (Oral)  SpO2 100%  Physical Exam General: No apparent distress alert and oriented x3 mood and affect normal Respiratory: Patient's speak in full sentences and does not appear short of breath Skin: Warm dry intact with no signs of infection or rash Neuro: Cranial nerves II through XII are intact, neurovascularly intact in all extremities with 2+ DTRs and 2+ pulses. Left foot exam: On exam, no gross  deformity, patient does have mild breakdown of longitudinal arch.  NT on dorsal aspect of the foot. Patient though does have tenderness. On medial heel to palpation.  Full AROM of ankle, NT on medial and lateral malleolus. NT on base of fifth.  ED Course  Procedures (including critical care time)  Labs Reviewed - No data to display No results found.   1. Plantar fascia syndrome     MDM  Patient does have insoles in shoes which are good.  Discussed getting arch straps.  Given HEP to help with stretching and discussed stretching protocol.  Toradol given today mand rx for meloxicam given. F/u with pcp or ortho if not better in 2-4 weeks for further intervention.   No xrays secondary to [place] ankle rules and no deformity.         Judi Saa, DO 12/15/12 1931

## 2012-12-15 NOTE — ED Notes (Signed)
Pt   Reports    Pain    And  Swelling  l  Foot        X   3  Months           Pain is  On  Top  Of  Foot               denys  Any  specefic  Injury

## 2012-12-16 NOTE — ED Provider Notes (Signed)
Medical screening examination/treatment/procedure(s) were performed by resident physician or non-physician practitioner and as supervising physician I was immediately available for consultation/collaboration.   KINDL,JAMES DOUGLAS MD.   James D Kindl, MD 12/16/12 2021 

## 2013-05-29 IMAGING — CT CT HEART MORP W/ CTA COR W/ SCORE W/ CA W/CM &/OR W/O CM
2 of 6 series · 12 of 20 positions shown, 15 images · IV contrast (omnipaque)
Comparison: None

INDICATION: 45-year-old male with history of chest pain.

CT ANGIOGRAPHY OF THE HEART, CORONARY ARTERY, STRUCTURE, AND
MORPHOLOGY
CONTRAST: 80mL OMNIPAQUE IOHEXOL 350 MG/ML SOLN
TECHNIQUE: CT angiography of the coronary vessels was performed on
a 256 channel system using prospective ECG gating.  A scout and
noncontrast exam (for calcium scoring) were performed.  Circulation
time was measured using a test bolus.  Coronary CTA was performed
with sub mm slice collimation during portions of the cardiac cycle
after prior injection of iodinated contrast.  Imaging post
processing was performed on an independent workstation creating
multiplanar and 3-D images, and quantitative analysis of the heart
and coronary arteries.  Note that this exam targets the heart and
the chest was not imaged in its entirety.
PREMEDICATION:
Lopressor 50 mg, P.O.
Lopressor 10 mg, IV
Nitroglycerin 400 mcg, sublingual.

[Series 8: 78% w/o ec, 78.0% · axial · non-contrast · 0.49mm/px · z∈[-212,-150]mm · 3 of 311 slices shown]
[im 78/311  vessel]
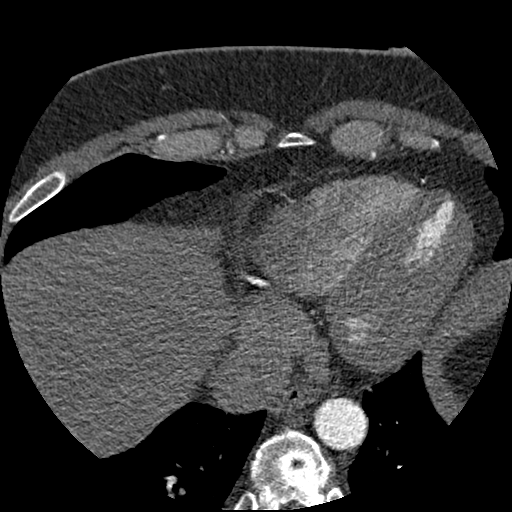
[im 156/311  vessel]
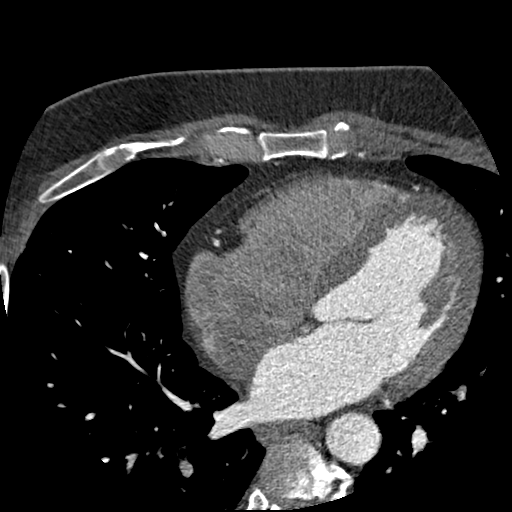
[im 233/311  vessel]
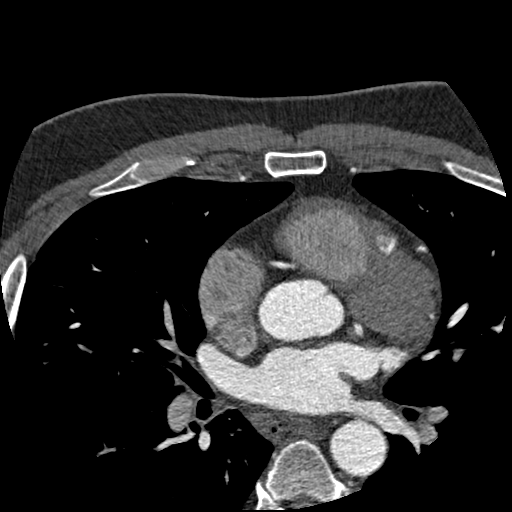

[mpr, thin mpr, axial · axial · 0.49mm/px · z∈[-230,-131]mm · 9 of 619 slices shown, 12 images]
[im 62/619  vessel]
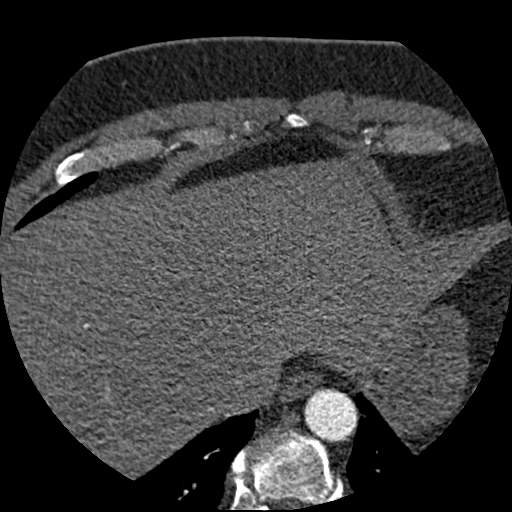
[im 62/619  lung]
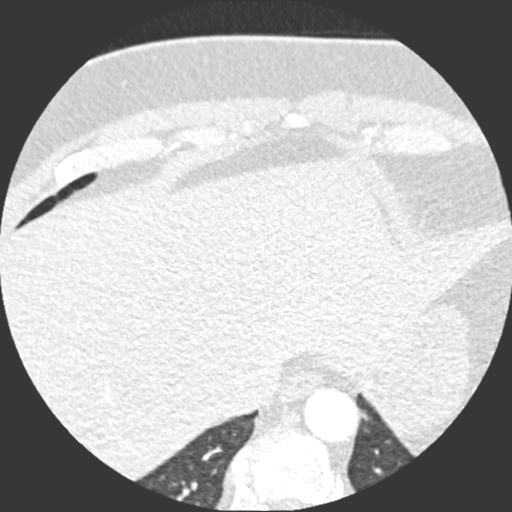
[im 124/619  vessel]
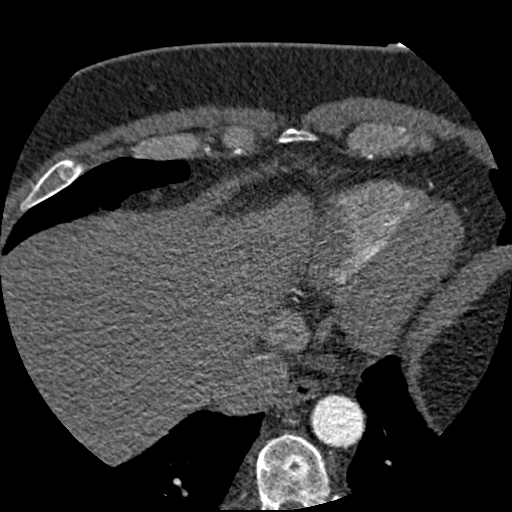
[im 186/619  vessel]
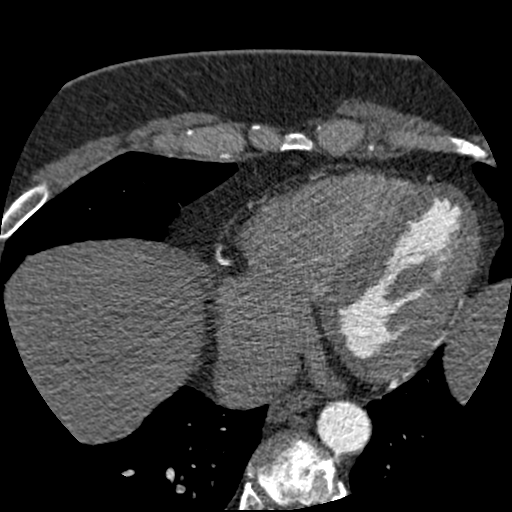
[im 248/619  vessel]
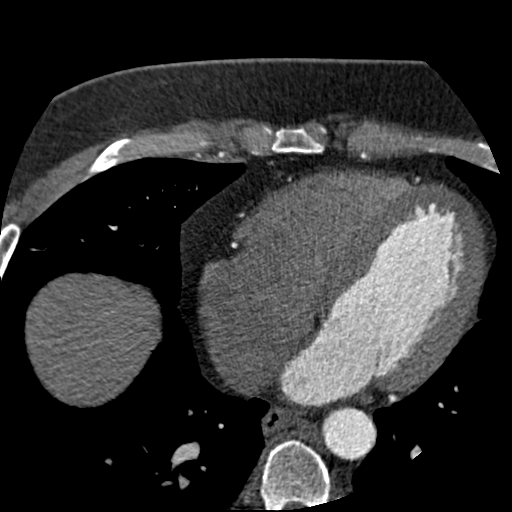
[im 310/619  vessel]
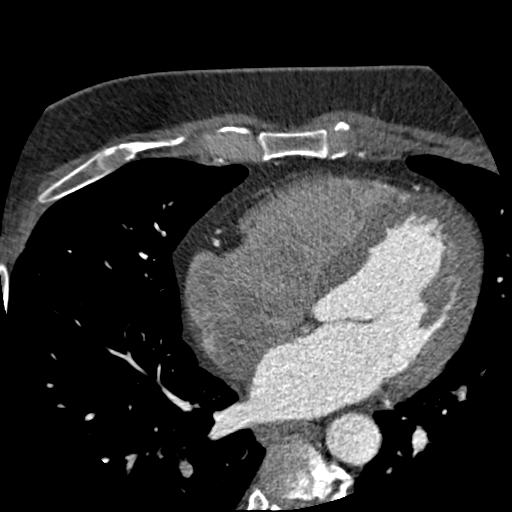
[im 310/619  lung]
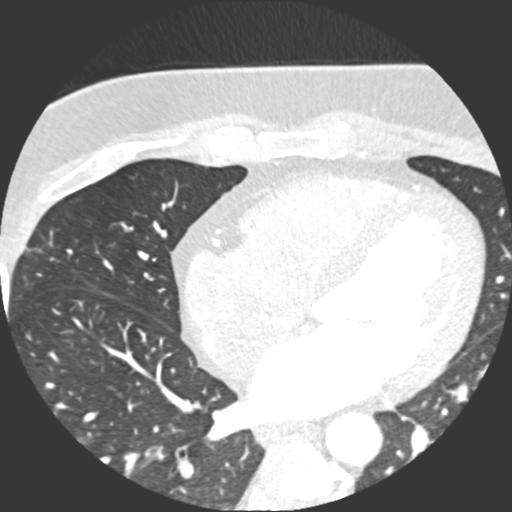
[im 371/619  vessel]
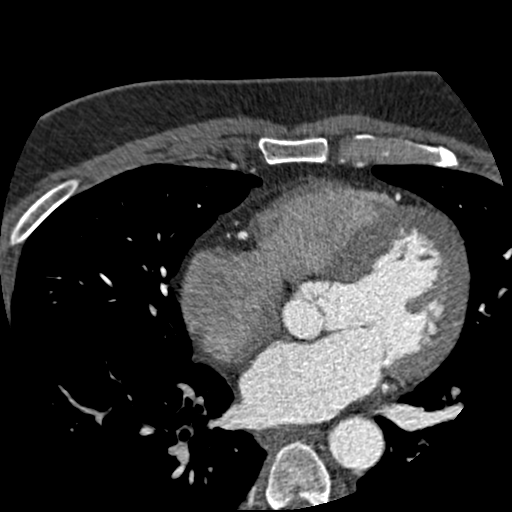
[im 433/619  vessel]
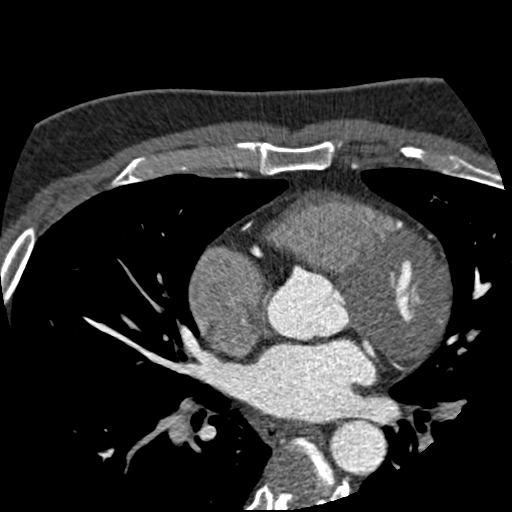
[im 495/619  vessel]
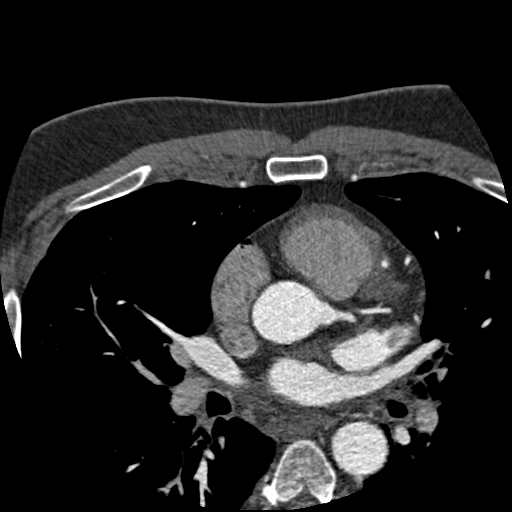
[im 557/619  vessel]
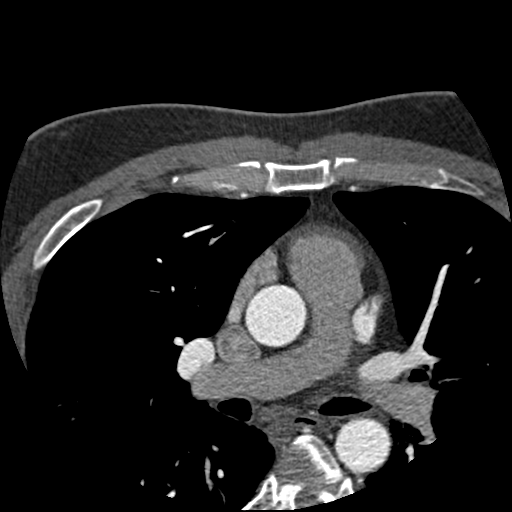
[im 557/619  lung]
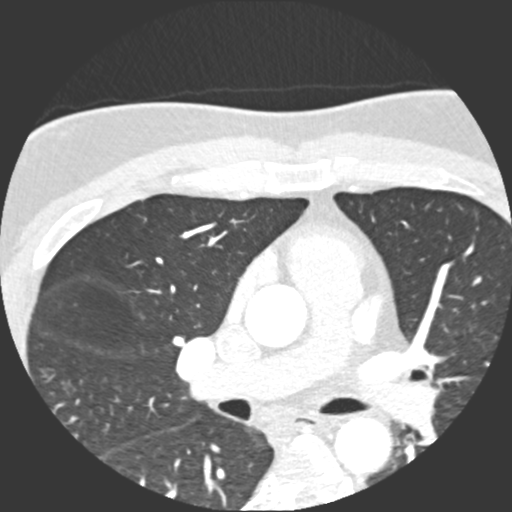

[12 of 20 positions shown; findings below may reference images not displayed]

FINDINGS: Technical quality:  Excellent.

Heart rate:  51

CORONARY ARTERIES:
Left main coronary artery:  Negative.
Left anterior descending:  Negative for atherosclerosis.  A short
segment of shallow myocardial bridging is noted in the mid left
anterior descending coronary artery, without any associated luminal
narrowing on diastolic images.
Left circumflex:  Negative.
Right coronary artery:  Negative.
Posterior descending artery:  Negative.
Dominance:  Codominant

CORONARY CALCIUM:
Total Agatston Score:  0
[HOSPITAL] percentile:  N/A

AORTA AND PULMONARY MEASUREMENTS:
Aortic root (21 - 40 mm):
            24 mm  at the annulus
            34 mm  at the sinuses of Valsalva
            25 mm  at the sinotubular junction
Ascending aorta ( <  40 mm):  31 mm
Descending aorta ( <  40 mm):  26 mm
Main pulmonary artery:  ( <  30 mm):  27 mm

EXTRACARDIAC FINDINGS:
Minimal dependent subsegmental atelectasis in the lower lobes of
the lungs bilaterally.  Small area of scarring is noted in the
lateral segment of the right middle lobe.  No definite suspicious
appearing pulmonary nodules or masses are identified within the
visualized lungs.  Visualized portions of the upper abdomen are
unremarkable. There are no aggressive appearing lytic or blastic
lesions noted in the visualized portions of the skeleton.
IMPRESSION: 1.  No evidence of significant coronary artery disease.  The
patient's total coronary artery calcium score is 0.
2.  No acute findings in the visualized thorax to account for the
patient's symptoms
3.  Codominance of the coronary arteries.

01/15/12.

## 2013-06-28 ENCOUNTER — Other Ambulatory Visit: Payer: Self-pay | Admitting: Internal Medicine

## 2013-06-28 ENCOUNTER — Encounter: Payer: Self-pay | Admitting: Internal Medicine

## 2013-06-28 ENCOUNTER — Ambulatory Visit
Admission: RE | Admit: 2013-06-28 | Discharge: 2013-06-28 | Disposition: A | Discharge status: Home / Self Care | Payer: BC Managed Care – PPO | Source: Ambulatory Visit | Attending: Internal Medicine | Admitting: Internal Medicine

## 2013-06-28 ENCOUNTER — Ambulatory Visit (INDEPENDENT_AMBULATORY_CARE_PROVIDER_SITE_OTHER): Payer: BC Managed Care – PPO | Admitting: Internal Medicine

## 2013-06-28 VITALS — BP 140/82 | HR 97 | Ht 76.0 in | Wt 263.7 lb

## 2013-06-28 DIAGNOSIS — K625 Hemorrhage of anus and rectum: Secondary | ICD-10-CM

## 2013-06-28 DIAGNOSIS — R410 Disorientation, unspecified: Secondary | ICD-10-CM

## 2013-06-28 DIAGNOSIS — R195 Other fecal abnormalities: Secondary | ICD-10-CM

## 2013-06-28 MED ORDER — MOVIPREP 100 G PO SOLR: 1.0000 | Freq: Once | ORAL | Status: DC

## 2013-06-28 NOTE — Patient Instructions (Signed)
You have been scheduled for a colonoscopy with propofol. Please follow written instructions given to you at your visit today.  Please pick up your prep kit at the pharmacy within the next 1-3 days. If you use inhalers (even only as needed), please bring them with you on the day of your procedure.  

## 2013-06-28 NOTE — Progress Notes (Signed)
HISTORY OF PRESENT ILLNESS:  Ryan Zimmerman is a 47 y.o. male with no significant past medical history who is referred today regarding rectal bleeding and Hemoccult-positive stool. The patient reports new onset intermittent rectal bleeding over the past 6 months. He describes it as near daily. Blood is noticed in the toilet bowl and on the paper. There is no associated abdominal or rectal discomfort. Rectal exam by his PCP was unrevealing. She submitted Hemoccult cards in September which were positive. Review of outside laboratories shows normal hemoglobin of 16.1 and normal MCV. His mother has a history of colon polyps, but no family history of colon cancer.  REVIEW OF SYSTEMS:  All non-GI ROS negative upon complete review  Past Medical History  Diagnosis Date  . Osteoarthritis of knee 05/24/2012  . Depression 05/24/2012  . Erectile dysfunction 05/24/2012  . Elevated LFTs   . Chronic back pain   . Concussion     History reviewed. No pertinent past surgical history.  Social History Ryan Zimmerman  reports that he has quit smoking. He has never used smokeless tobacco. He reports that he drinks alcohol. He reports that he does not use illicit drugs.  family history includes Arthritis in his other; Breast cancer in his maternal aunt and mother; Colon polyps in his mother; Hypertension in his other.  Allergies  Allergen Reactions  . Hydrocodone     itching       PHYSICAL EXAMINATION: Vital signs: BP 140/82  Pulse 97  Ht 6\' 4"  (1.93 m)  Wt 263 lb 11.2 oz (119.614 kg)  BMI 32.11 kg/m2 General: Well-developed, well-nourished, no acute distress HEENT: Sclerae are anicteric, conjunctiva pink. Oral mucosa intact Lungs: Clear Heart: Regular Abdomen: soft, nontender, nondistended, no obvious ascites, no peritoneal signs, normal bowel sounds. No organomegaly. Rectal: Deferred until colonoscopy Extremities: No edema Psychiatric: alert and oriented x3. Cooperative   ASSESSMENT:  #1.  Six-month history of intermittent rectal bleeding. Rule out neoplasia #2. Hemoccult-positive stool. Rule out neoplasia   PLAN:  #1. Colonoscopy.The nature of the procedure, as well as the risks, benefits, and alternatives were carefully and thoroughly reviewed with the patient. Ample time for discussion and questions allowed. The patient understood, was satisfied, and agreed to proceed. Movi prep prescribed. The patient instructed on its use

## 2013-06-29 ENCOUNTER — Other Ambulatory Visit: Payer: Self-pay

## 2013-06-30 ENCOUNTER — Other Ambulatory Visit: Payer: BC Managed Care – PPO

## 2013-06-30 ENCOUNTER — Encounter: Payer: Self-pay | Admitting: Internal Medicine

## 2013-07-03 ENCOUNTER — Ambulatory Visit (AMBULATORY_SURGERY_CENTER): Payer: BC Managed Care – PPO | Admitting: Internal Medicine

## 2013-07-03 ENCOUNTER — Encounter: Payer: Self-pay | Admitting: Internal Medicine

## 2013-07-03 VITALS — BP 120/82 | HR 60 | Temp 96.1°F | Resp 9 | Ht 76.0 in | Wt 263.0 lb

## 2013-07-03 DIAGNOSIS — K625 Hemorrhage of anus and rectum: Secondary | ICD-10-CM

## 2013-07-03 DIAGNOSIS — D126 Benign neoplasm of colon, unspecified: Secondary | ICD-10-CM

## 2013-07-03 MED ORDER — SODIUM CHLORIDE 0.9 % IV SOLN: 500.0000 mL | INTRAVENOUS | Status: DC

## 2013-07-03 NOTE — Op Note (Signed)
[facility] [address] [place], [address]   COLONOSCOPY PROCEDURE REPORT  PATIENT: Ryan Zimmerman, Ryan Zimmerman  MR#: [id number] BIRTHDATE: [birth year] , 46  yrs. old GENDER: Male ENDOSCOPIST: Roxy Cedar, MD REFERRED AO:ZHYQM Link Snuffer, M.D. PROCEDURE DATE:  07/03/2013 PROCEDURE:   Colonoscopy with snare polypectomy x 3 First Screening Colonoscopy - Avg.  risk and is 50 yrs.  old or older - No.  Prior Negative Screening - Now for repeat screening. N/A  History of Adenoma - Now for follow-up colonoscopy & has been > or = to 3 yrs.  N/A  Polyps Removed Today? Yes. ASA CLASS:   Class I INDICATIONS:rectal bleeding and heme-positive stool. MEDICATIONS: MAC sedation, administered by CRNA and propofol (Diprivan) 520mg  IV  DESCRIPTION OF PROCEDURE:   After the risks benefits and alternatives of the procedure were thoroughly explained, informed consent was obtained.  A digital rectal exam revealed no abnormalities of the rectum.   The LB [id number]  endoscope was introduced through the anus and advanced to the cecum, which was identified by both the appendix and ileocecal valve. No adverse events experienced.   The quality of the prep was excellent, using MoviPrep  The instrument was then slowly withdrawn as the colon was fully examined.   COLON FINDINGS: Three polyps were found : 10mm pedunculated ascending and 25mm pedunculated sigmoid (at 35cm) removed snare cautery. 4mm ascending colon polyp removed with cold snare.  The resections were complete and the polyp tissue was completely retrieved.   The colon mucosa was otherwise normal.  Retroflexed views revealed no abnormalities. The time to cecum=2 minutes 25 seconds.  Withdrawal time=20 minutes 15 seconds.  The scope was withdrawn and the procedure completed. COMPLICATIONS: There were no complications.  ENDOSCOPIC IMPRESSION: 1.   Three  polyps were found as described; polypectomy was performed using snare cautery  (2) and cold snare 2.   The colon mucosa was otherwise normal  RECOMMENDATIONS: 1. Repeat Colonoscopy in 1 or 3 years (pending path).   eSigned:  Roxy Cedar, MD 07/03/2013 10:15 AM   cc: The Patient    ; Alysia Penna, MD

## 2013-07-03 NOTE — Progress Notes (Signed)
Procedure ends, to recovery, report given and VSS. 

## 2013-07-03 NOTE — Patient Instructions (Signed)
YOU HAD AN ENDOSCOPIC PROCEDURE TODAY AT THE [facility]: Refer to the procedure report that was given to you for any specific questions about what was found during the examination.  If the procedure report does not answer your questions, please call your gastroenterologist to clarify.  If you requested that your care partner not be given the details of your procedure findings, then the procedure report has been included in a sealed envelope for you to review at your convenience later.  YOU SHOULD EXPECT: Some feelings of bloating in the abdomen. Passage of more gas than usual.  Walking can help get rid of the air that was put into your GI tract during the procedure and reduce the bloating. If you had a lower endoscopy (such as a colonoscopy or flexible sigmoidoscopy) you may notice spotting of blood in your stool or on the toilet paper. If you underwent a bowel prep for your procedure, then you may not have a normal bowel movement for a few days.  DIET: Your first meal following the procedure should be a light meal and then it is ok to progress to your normal diet.  A half-sandwich or bowl of soup is an example of a good first meal.  Heavy or fried foods are harder to digest and may make you feel nauseous or bloated.  Likewise meals heavy in dairy and vegetables can cause extra gas to form and this can also increase the bloating.  Drink plenty of fluids but you should avoid alcoholic beverages for 24 hours.  ACTIVITY: Your care partner should take you home directly after the procedure.  You should plan to take it easy, moving slowly for the rest of the day.  You can resume normal activity the day after the procedure however you should NOT DRIVE or use heavy machinery for 24 hours (because of the sedation medicines used during the test).    SYMPTOMS TO REPORT IMMEDIATELY: A gastroenterologist can be reached at any hour.  During normal business hours, 8:30 AM to 5:00 PM Monday through Friday,  call [phone].  After hours and on weekends, please call the GI answering service at [phone] who will take a message and have the physician on call contact you.   Following lower endoscopy (colonoscopy or flexible sigmoidoscopy):  Excessive amounts of blood in the stool  Significant tenderness or worsening of abdominal pains  Swelling of the abdomen that is new, acute  Fever of 100F or higher    FOLLOW UP: If any biopsies were taken you will be contacted by phone or by letter within the next 1-3 weeks.  Call your gastroenterologist if you have not heard about the biopsies in 3 weeks.  Our staff will call the home number listed on your records the next business day following your procedure to check on you and address any questions or concerns that you may have at that time regarding the information given to you following your procedure. This is a courtesy call and so if there is no answer at the home number and we have not heard from you through the emergency physician on call, we will assume that you have returned to your regular daily activities without incident.  SIGNATURES/CONFIDENTIALITY: You and/or your care partner have signed paperwork which will be entered into your electronic medical record.  These signatures attest to the fact that that the information above on your After Visit Summary has been reviewed and is understood.  Full responsibility of the confidentiality   of this discharge information lies with you and/or your care-partner.    Information on polyps given to you today 

## 2013-07-03 NOTE — Progress Notes (Signed)
Called to room to assist during endoscopic procedure.  Patient ID and intended procedure confirmed with present staff. Received instructions for my participation in the procedure from the performing physician.  

## 2013-07-04 ENCOUNTER — Telehealth: Payer: Self-pay | Admitting: *Deleted

## 2013-07-04 NOTE — Telephone Encounter (Signed)
No answer, left message to call if questions or concerns. 

## 2013-07-12 ENCOUNTER — Encounter: Payer: Self-pay | Admitting: Internal Medicine

## 2013-07-17 ENCOUNTER — Ambulatory Visit: Payer: BC Managed Care – PPO | Admitting: Neurology

## 2013-07-31 ENCOUNTER — Ambulatory Visit (INDEPENDENT_AMBULATORY_CARE_PROVIDER_SITE_OTHER): Payer: BC Managed Care – PPO | Admitting: Neurology

## 2013-07-31 ENCOUNTER — Encounter: Payer: Self-pay | Admitting: Neurology

## 2013-07-31 VITALS — BP 118/74 | HR 74 | Ht 75.0 in | Wt 263.0 lb

## 2013-07-31 DIAGNOSIS — F329 Major depressive disorder, single episode, unspecified: Secondary | ICD-10-CM

## 2013-07-31 DIAGNOSIS — R413 Other amnesia: Secondary | ICD-10-CM

## 2013-07-31 MED ORDER — BUPROPION HCL ER (SR) 150 MG PO TB12: 150.0000 mg | ORAL_TABLET | Freq: Two times a day (BID) | ORAL | Status: AC

## 2013-07-31 NOTE — Progress Notes (Signed)
[facility]  PATIENT: Ryan Zimmerman DOB: 03/24/1966  HISTORICAL [name] is a 47 years old right-handed African American male, was referred by his primary care physician Dr. Link Snuffer for evaluation of memory loss.  He suffered past medical history of depression, there was also strained relationship, he had high school graduate, works as a Mudlogger, Scientist, product/process development support for [employer],   He complains of memory trouble is in September 2014, during stress family situation, he complains that his memory loss to the point of affecting his daily job, forgetting  conversation, has difficulty performing his job, forgot what he is typing, take longer on the phone, he is much less active, after work, he stay home watching TV, get angry with people easily, does not want to socialize with people, he been taking multiple marijuana every day, also taking tramadol as needed for his low back pain.  REVIEW OF SYSTEMS: Full 14 system review of systems performed and notable only for fatigue, chest pain, blurry vision, joint pain, cramps, achy muscles, memory loss, confusion, weakness, restless legs, depression, decreased energy, change in appetite, disinterested in activities, racing thoughts,  ALLERGIES: Allergies  Allergen Reactions  . Hydrocodone     itching    HOME MEDICATIONS: Outpatient Prescriptions Prior to Visit  Medication Sig Dispense Refill  . cyclobenzaprine (FLEXERIL) 5 MG tablet Take 1 tablet (5 mg total) by mouth 3 (three) times daily as needed for muscle spasms.  60 tablet  2  . traMADol (ULTRAM) 50 MG tablet Take 50 mg by mouth as needed.       No facility-administered medications prior to visit.    PAST MEDICAL HISTORY: Past Medical History  Diagnosis Date  . Osteoarthritis of knee 05/24/2012  . Depression 05/24/2012  . Erectile dysfunction 05/24/2012  . Elevated LFTs   . Chronic back pain   . Concussion   . Memory loss     PAST SURGICAL  HISTORY: Past Surgical History  Procedure Laterality Date  . None      FAMILY HISTORY: Family History  Problem Relation Age of Onset  . Breast cancer Mother   . Colon polyps Mother   . Arthritis Other   . Hypertension Other   . Breast cancer Maternal Aunt   . Colon cancer Neg Hx     SOCIAL HISTORY:  History   Social History  . Marital Status: Married    Spouse Name: Leanne    Number of Children: 3  . Years of Education: 13   Occupational History  .  [employer]   Social History Main Topics  . Smoking status: Current Every Day Smoker    Types: Cigarettes  . Smokeless tobacco: Never Used  . Alcohol Use: 0.6 oz/week    1 Cans of beer per week     Comment: social  . Drug Use: Yes     Comment: marijuana  . Sexual Activity: Not on file   Other Topics Concern  . Not on file   Social History Narrative   Patient is married [name]).   Patient rarely drinks caffeine beverages.   Education- some college.   Patient works full time - [employer]   Right handed.   Caffeine- None     PHYSICAL EXAM   Filed Vitals:   07/31/13 0939  BP: 118/74  Pulse: 74  Height: 6\' 3"  (1.905 m)  Weight: 263 lb (119.296 kg)    Not recorded    Body mass index is  32.87 kg/(m^2).   Generalized: In no acute distress  Neck: Supple, no carotid bruits   Cardiac: Regular rate rhythm  Pulmonary: Clear to auscultation bilaterally  Musculoskeletal: No deformity  Neurological examination  Mentation: Alert oriented to time, place, history taking, and causual conversation  Cranial nerve II-XII: Pupils were equal round reactive to light extraocular movements were full, Visual field were full on confrontational test. Bilateral fundi were sharp.  Facial sensation and strength were normal. Hearing was intact to finger rubbing bilaterally. Uvula tongue midline.  head turning and shoulder shrug and were normal and symmetric.Tongue protrusion into cheek strength was  normal.  Motor: normal tone, bulk and strength.  Sensory: Intact to fine touch, pinprick, preserved vibratory sensation, and proprioception at toes.  Coordination: Normal finger to nose, heel-to-shin bilaterally there was no truncal ataxia  Gait: Rising up from seated position without assistance, normal stance, without trunk ataxia, moderate stride, good arm swing, smooth turning, able to perform tiptoe, and heel walking without difficulty.   Romberg signs: Negative  Deep tendon reflexes: Brachioradialis 2/2, biceps 2/2, triceps 2/2, patellar 2/2, Achilles 2/2, plantar responses were flexor bilaterally.   DIAGNOSTIC DATA (LABS, IMAGING, TESTING) - I reviewed patient records, labs, notes, testing and imaging myself where available.  Lab Results  Component Value Date   WBC 7.1 05/24/2012   HGB 15.9 05/24/2012   HCT 48.2 05/24/2012   MCV 87.6 05/24/2012   PLT 210.0 05/24/2012      Component Value Date/Time   NA 138 05/24/2012 1103   K 4.5 05/24/2012 1103   CL 105 05/24/2012 1103   CO2 28 05/24/2012 1103   GLUCOSE 92 05/24/2012 1103   BUN 12 05/24/2012 1103   CREATININE 1.2 05/24/2012 1103   CALCIUM 9.0 05/24/2012 1103   PROT 6.5 05/24/2012 1103   ALBUMIN 3.7 05/24/2012 1103   AST 19 05/24/2012 1103   ALT 27 05/24/2012 1103   ALKPHOS 48 05/24/2012 1103   BILITOT 0.9 05/24/2012 1103   GFRNONAA 72* 02/12/2012 1306   GFRAA 83* 02/12/2012 1306   Lab Results  Component Value Date   CHOL 200 05/24/2012   HDL 34.20* 05/24/2012   LDLDIRECT 121.7 05/24/2012   TRIG 249.0* 05/24/2012   CHOLHDL 6 05/24/2012   No results found for this basename: HGBA1C   No results found for this basename: VITAMINB12   Lab Results  Component Value Date   TSH 0.92 05/24/2012     ASSESSMENT AND PLAN   47 years old  [place] male, with history of depression now presenting with mild memory trouble, affecting his working ability.  1. most consistent with his mood disorder 2. complete evaluation with MRI of  the brain laboratory evaluations  3 start Wellbutrin 4 return to clinic with Eber Jones in 3 months, if there is no significant abnormality on workup, the next step would be refer him to a psychiatrist.  Levert Feinstein, M.D. Ph.D.  [facility] [address], [address] [place], [place] [address] [phone]

## 2013-08-01 LAB — COMPREHENSIVE METABOLIC PANEL
ALT: 22 IU/L (ref 0–44)
AST: 15 IU/L (ref 0–40)
Albumin: 4.1 g/dL (ref 3.5–5.5)
Alkaline Phosphatase: 54 IU/L (ref 39–117)
BUN/Creatinine Ratio: 8 — ABNORMAL LOW (ref 9–20)
BUN: 9 mg/dL (ref 6–24)
Calcium: 9.4 mg/dL (ref 8.7–10.2)
Chloride: 103 mmol/L (ref 97–108)
Creatinine, Ser: 1.06 mg/dL (ref 0.76–1.27)
GFR calc Af Amer: 96 mL/min/{1.73_m2} (ref >59)
Globulin, Total: 2.4 g/dL (ref 1.5–4.5)
Glucose: 123 mg/dL — ABNORMAL HIGH (ref 65–99)
Sodium: 142 mmol/L (ref 134–144)
Total Bilirubin: 0.6 mg/dL (ref 0.0–1.2)
Total Protein: 6.5 g/dL (ref 6.0–8.5)

## 2013-08-01 LAB — HIV ANTIBODY (ROUTINE TESTING W REFLEX)
HIV 1/O/2 Abs-Index Value: <1 (ref <1.00)
HIV-1/HIV-2 Ab: NONREACTIVE

## 2013-08-01 LAB — CBC WITH DIFFERENTIAL
Eos: 1 %
Eosinophils Absolute: 0.1 10*3/uL (ref 0.0–0.4)
HCT: 47.5 % (ref 37.5–51.0)
Immature Granulocytes: 0 %
Lymphocytes Absolute: 2.8 10*3/uL (ref 0.7–3.1)
Lymphs: 45 %
MCH: 28.4 pg (ref 26.6–33.0)
MCV: 81 fL (ref 79–97)
Monocytes Absolute: 0.5 10*3/uL (ref 0.1–0.9)
Monocytes: 7 %
Neutrophils Absolute: 2.8 10*3/uL (ref 1.4–7.0)
Platelets: 233 10*3/uL (ref 150–379)
RBC: 5.84 x10E6/uL — ABNORMAL HIGH (ref 4.14–5.80)
RDW: 13.8 % (ref 12.3–15.4)
WBC: 6.2 10*3/uL (ref 3.4–10.8)

## 2013-08-01 LAB — C-REACTIVE PROTEIN: CRP: 7.4 mg/L — ABNORMAL HIGH (ref 0.0–4.9)

## 2013-08-01 LAB — RPR: RPR: NONREACTIVE

## 2013-08-01 LAB — VITAMIN B12: Vitamin B-12: 452 pg/mL (ref 211–946)

## 2013-08-01 LAB — THYROID PANEL WITH TSH: T4, Total: 9.3 ug/dL (ref 4.5–12.0)

## 2013-08-01 LAB — ANA: Anti Nuclear Antibody(ANA): NEGATIVE

## 2013-08-01 LAB — FOLATE: Folate: 12.1 ng/mL (ref >3.0)

## 2013-08-01 NOTE — Progress Notes (Signed)
Quick Note:  Please call patient, mild elevated glucose can be related to the timing of the sample, mild elevated CRP of unknown clinical significance. ______

## 2013-08-03 NOTE — Progress Notes (Signed)
Quick Note:  Left message with lab results, per Dr. Yan. Told to call with any questions. ______ 

## 2013-10-30 ENCOUNTER — Telehealth: Payer: Self-pay | Admitting: Nurse Practitioner

## 2013-10-30 ENCOUNTER — Ambulatory Visit: Payer: BC Managed Care – PPO | Admitting: Nurse Practitioner

## 2013-10-30 NOTE — Telephone Encounter (Signed)
No show for scheduled appt 

## 2014-07-03 ENCOUNTER — Encounter (HOSPITAL_COMMUNITY): Payer: Self-pay | Admitting: *Deleted

## 2014-07-03 ENCOUNTER — Emergency Department (HOSPITAL_COMMUNITY)
Admission: EM | Admit: 2014-07-03 | Discharge: 2014-07-03 | Disposition: A | Discharge status: Home / Self Care | Payer: BC Managed Care – PPO | Source: Home / Self Care | Attending: Family Medicine | Admitting: Family Medicine

## 2014-07-03 ENCOUNTER — Emergency Department (INDEPENDENT_AMBULATORY_CARE_PROVIDER_SITE_OTHER): Payer: BC Managed Care – PPO

## 2014-07-03 DIAGNOSIS — M79641 Pain in right hand: Secondary | ICD-10-CM

## 2014-07-03 DIAGNOSIS — K219 Gastro-esophageal reflux disease without esophagitis: Secondary | ICD-10-CM

## 2014-07-03 MED ORDER — OMEPRAZOLE 40 MG PO CPDR: 40.0000 mg | DELAYED_RELEASE_CAPSULE | Freq: Every day | ORAL | Status: AC

## 2014-07-03 MED ORDER — PREDNISONE 10 MG PO KIT: PACK | ORAL | Status: AC

## 2014-07-03 NOTE — ED Provider Notes (Signed)
Ryan Zimmerman is a 48 y.o. male who presents to Urgent Care today for Right hand pain and swelling present for 6 months. Symptoms have occurred off and on in her worse recently. He had an x-ray at his doctor's office of the last several months which was negative. He has not had any treatment yet.  Additionally patient notes heartburn present for months worse of the last 6 days. He feels nauseated and a burning sensation. He has not tried any medications.   Past Medical History  Diagnosis Date  . Osteoarthritis of knee 05/24/2012  . Depression 05/24/2012  . Erectile dysfunction 05/24/2012  . Elevated LFTs   . Chronic back pain   . Concussion   . Memory loss    Past Surgical History  Procedure Laterality Date  . None     History  Substance Use Topics  . Smoking status: Current Every Day Smoker -- 0.10 packs/day    Types: Cigarettes  . Smokeless tobacco: Never Used  . Alcohol Use: 0.6 oz/week    1 Cans of beer per week     Comment: social   ROS as above Medications: No current facility-administered medications for this encounter.   Current Outpatient Prescriptions  Medication Sig Dispense Refill  . buPROPion (WELLBUTRIN SR) 150 MG 12 hr tablet Take 1 tablet (150 mg total) by mouth 2 (two) times daily. 301 tablet 12  . cyclobenzaprine (FLEXERIL) 5 MG tablet Take 1 tablet (5 mg total) by mouth 3 (three) times daily as needed for muscle spasms. 60 tablet 2  . MOVIPREP 100 G SOLR     . omeprazole (PRILOSEC) 40 MG capsule Take 1 capsule (40 mg total) by mouth daily. 30 capsule 1  . PredniSONE 10 MG KIT 12 day dose pack po 1 kit 0  . traMADol (ULTRAM) 50 MG tablet Take 50 mg by mouth as needed.     Allergies  Allergen Reactions  . Codeine Itching     Exam:  BP 129/85 mmHg  Pulse 85  Temp(Src) 98.4 F (36.9 C) (Oral)  Resp 12  SpO2 98% Gen: Well NAD HEENT: EOMI,  MMM Lungs: Normal work of breathing. CTABL Heart: RRR no MRG Abd: NABS, Soft. Nondistended, Nontender Exts:  Brisk capillary refill, warm and well perfused.  Right hand: normal-appearing no significant swelling. Mildly warm. Normal motion pulses Refill and sensation.  No results found for this or any previous visit (from the past 24 hour(s)). Dg Hand Complete Right  07/03/2014   CLINICAL DATA:  Pain, cramping sensation  EXAM: RIGHT HAND - COMPLETE 3+ VIEW  COMPARISON:  None.  FINDINGS: There is no evidence of fracture or dislocation. There is no evidence of arthropathy or other focal bone abnormality. Soft tissues are unremarkable.  IMPRESSION: No acute osseous injury of the right hand.   Electronically Signed   By: Kathreen Devoid   On: 07/03/2014 18:27    Assessment and Plan: 48 y.o. male with Right hand and wrist pain. Likely overuse tenosynovitis. Plan for wrist splint, prednisone and home exercise program.  Additionally patient has reflux symptoms. Plan for trial of omeprazole.  Follow up with PCP  Discussed warning signs or symptoms. Please see discharge instructions. Patient expresses understanding.     Gregor Hams, MD 07/03/14 [phone]

## 2014-07-03 NOTE — Discharge Instructions (Signed)
Thank you for coming in today. Take prednisone daily as directedand use the brace as needed. Take omeprazole daily for heartburn  Wrist Exercises RANGE OF MOTION (ROM) AND STRETCHING EXERCISES - Wrist Sprain  These exercises may help you when beginning to rehabilitate your injury. Your symptoms may resolve with or without further involvement from your physician, physical therapist, or athletic trainer. While completing these exercises, remember:   Restoring tissue flexibility helps normal motion to return to the joints. This allows healthier, less painful movement and activity.  An effective stretch should be held for at least 30 seconds.  A stretch should never be painful. You should only feel a gentle lengthening or release in the stretched tissue. RANGE OF MOTION - Wrist Flexion, Active-Assisted  Extend your right / left elbow with your palm pointing down.*  Gently pull the back of your hand toward you until you feel a gentle stretch on the top of your forearm.  Hold this position for __________ seconds. Repeat __________ times. Complete this exercise __________ times per day.  *If directed by your physician, physical therapist, or athletic trainer, complete this stretch with your elbow bent rather than extended. RANGE OF MOTION - Wrist Extension, Active-Assisted   Extend your right / left elbow and turn your palm upward.*  Gently pull your palm/fingertips back so your wrist extends and your fingers point more toward the ground.  You should feel a gentle stretch on the inside of your forearm.  Hold this position for __________ seconds. Repeat __________ times. Complete this exercise __________ times per day. *If directed by your physician, physical therapist, or athletic trainer, complete this stretch with your elbow bent, rather than extended. RANGE OF MOTION - Supination, Active   Stand or sit with your elbows at your side. Bend your right / left elbow to 90 degrees.  Turn  your palm upward until you feel a gentle stretch on the inside of your forearm.  Hold this position for __________ seconds. Slowly release and return to the starting position. Repeat __________ times. Complete this stretch __________ times per day.  RANGE OF MOTION - Pronation, Active   Stand or sit with your elbows at your side. Bend your right / left elbow to 90 degrees.  Turn your palm downward until you feel a gentle stretch on the top of your forearm.  Hold this position for __________ seconds. Slowly release and return to the starting position. Repeat __________ times. Complete this stretch __________ times per day.  STRENGTHENING EXERCISES  These exercises may help you when beginning to rehabilitate your injury. They may resolve your symptoms with or without further involvement from your physician, physical therapist, or athletic trainer. While completing these exercises, remember:   Muscles can gain both the endurance and the strength needed for everyday activities through controlled exercises.  Complete these exercises as instructed by your physician, physical therapist, or athletic trainer. Progress the resistance and repetitions only as guided.  You may experience muscle soreness or fatigue, but the pain or discomfort you are trying to eliminate should never worsen during these exercises. If this pain does worsen, stop and make certain you are following the directions exactly. If the pain is still present after adjustments, discontinue the exercise until you can discuss the trouble with your clinician. STRENGTH - Wrist Flexors  Sit with your right / left forearm palm-up and fully supported. Your elbow should be resting below the height of your shoulder. Allow your wrist to extend over the edge of the  surface.  Loosely holding a __________ weight or a piece of rubber exercise band/tubing, slowly curl your hand up toward your forearm.  Hold this position for __________ seconds.  Slowly lower the wrist back to the starting position in a controlled manner. Repeat __________ times. Complete this exercise __________ times per day.  STRENGTH - Wrist Extensors  Sit with your right / left forearm palm-down and fully supported. Your elbow should be resting below the height of your shoulder. Allow your wrist to extend over the edge of the surface.  Loosely holding a __________ weight or a piece of rubber exercise band/tubing, slowly curl your hand up toward your forearm.  Hold this position for __________ seconds. Slowly lower the wrist back to the starting position in a controlled manner. Repeat __________ times. Complete this exercise __________ times per day.  STRENGTH - Forearm Supinators  Sit with your right / left forearm supported on a table, keeping your elbow below shoulder height. Rest your hand over the edge, palm down.  Gently grip a hammer or a soup ladle.  Without moving your elbow, slowly turn your palm and hand upward to a "thumbs-up" position.  Hold this position for __________ seconds. Slowly return to the starting position. Repeat __________ times. Complete this exercise __________ times per day.  STRENGTH - Forearm Pronators   Sit with your right / left forearm supported on a table, keeping your elbow below shoulder height. Rest your hand over the edge, palm up.  Gently grip a hammer or a soup ladle.  Without moving your elbow, slowly turn your palm and hand upward to a "thumbs-up" position.  Hold this position for __________ seconds. Slowly return to the starting position. Repeat __________ times. Complete this exercise __________ times per day.  STRENGTH - Grip  Grasp a tennis ball, a dense sponge, or a large, rolled sock in your hand.  Squeeze as hard as you can without increasing any pain.  Hold this position for __________ seconds. Release your grip slowly. Repeat __________ times. Complete this exercise __________ times per day.    Document Released: 06/24/2005 Document Revised: 12/25/2013 Document Reviewed: 11/22/2008 [facility] Patient Information 2015 [place], [place]. This information is not intended to replace advice given to you by your health care provider. Make sure you discuss any questions you have with your health care provider.   Gastroesophageal Reflux Disease, Adult Gastroesophageal reflux disease (GERD) happens when acid from your stomach flows up into the esophagus. When acid comes in contact with the esophagus, the acid causes soreness (inflammation) in the esophagus. Over time, GERD may create small holes (ulcers) in the lining of the esophagus. CAUSES   Increased body weight. This puts pressure on the stomach, making acid rise from the stomach into the esophagus.  Smoking. This increases acid production in the stomach.  Drinking alcohol. This causes decreased pressure in the lower esophageal sphincter (valve or ring of muscle between the esophagus and stomach), allowing acid from the stomach into the esophagus.  Late evening meals and a full stomach. This increases pressure and acid production in the stomach.  A malformed lower esophageal sphincter. Sometimes, no cause is found. SYMPTOMS   Burning pain in the lower part of the mid-chest behind the breastbone and in the mid-stomach area. This may occur twice a week or more often.  Trouble swallowing.  Sore throat.  Dry cough.  Asthma-like symptoms including chest tightness, shortness of breath, or wheezing. DIAGNOSIS  Your caregiver may be able to diagnose GERD based  on your symptoms. In some cases, X-rays and other tests may be done to check for complications or to check the condition of your stomach and esophagus. TREATMENT  Your caregiver may recommend over-the-counter or prescription medicines to help decrease acid production. Ask your caregiver before starting or adding any new medicines.  HOME CARE INSTRUCTIONS   Change the factors that  you can control. Ask your caregiver for guidance concerning weight loss, quitting smoking, and alcohol consumption.  Avoid foods and drinks that make your symptoms worse, such as:  Caffeine or alcoholic drinks.  Chocolate.  Peppermint or mint flavorings.  Garlic and onions.  Spicy foods.  Citrus fruits, such as oranges, lemons, or limes.  Tomato-based foods such as sauce, chili, salsa, and pizza.  Fried and fatty foods.  Avoid lying down for the 3 hours prior to your bedtime or prior to taking a nap.  Eat small, frequent meals instead of large meals.  Wear loose-fitting clothing. Do not wear anything tight around your waist that causes pressure on your stomach.  Raise the head of your bed 6 to 8 inches with wood blocks to help you sleep. Extra pillows will not help.  Only take over-the-counter or prescription medicines for pain, discomfort, or fever as directed by your caregiver.  Do not take aspirin, ibuprofen, or other nonsteroidal anti-inflammatory drugs (NSAIDs). SEEK IMMEDIATE MEDICAL CARE IF:   You have pain in your arms, neck, jaw, teeth, or back.  Your pain increases or changes in intensity or duration.  You develop nausea, vomiting, or sweating (diaphoresis).  You develop shortness of breath, or you faint.  Your vomit is green, yellow, black, or looks like coffee grounds or blood.  Your stool is red, bloody, or black. These symptoms could be signs of other problems, such as heart disease, gastric bleeding, or esophageal bleeding. MAKE SURE YOU:   Understand these instructions.  Will watch your condition.  Will get help right away if you are not doing well or get worse. Document Released: 05/20/2005 Document Revised: 11/02/2011 Document Reviewed: 02/27/2011 [facility] Patient Information 2015 [place], [place]. This information is not intended to replace advice given to you by your health care provider. Make sure you discuss any questions you have with your  health care provider.

## 2014-07-03 NOTE — ED Notes (Signed)
C/o R hand pain and swelling for 6 mos.  No known injury.  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 Has tried heat. Aleve and Ibuprofen upsets his stomach.

## 2015-04-06 ENCOUNTER — Emergency Department (HOSPITAL_COMMUNITY)
Admission: EM | Admit: 2015-04-06 | Discharge: 2015-04-06 | Disposition: A | Discharge status: Home / Self Care | Payer: BLUE CROSS/BLUE SHIELD | Attending: Emergency Medicine | Admitting: Emergency Medicine

## 2015-04-06 ENCOUNTER — Encounter (HOSPITAL_COMMUNITY): Payer: Self-pay | Admitting: Emergency Medicine

## 2015-04-06 DIAGNOSIS — Z720 Tobacco use: Secondary | ICD-10-CM | POA: Diagnosis not present

## 2015-04-06 DIAGNOSIS — F329 Major depressive disorder, single episode, unspecified: Secondary | ICD-10-CM | POA: Insufficient documentation

## 2015-04-06 DIAGNOSIS — G8929 Other chronic pain: Secondary | ICD-10-CM | POA: Diagnosis not present

## 2015-04-06 DIAGNOSIS — Z79899 Other long term (current) drug therapy: Secondary | ICD-10-CM | POA: Insufficient documentation

## 2015-04-06 DIAGNOSIS — Z87438 Personal history of other diseases of male genital organs: Secondary | ICD-10-CM | POA: Insufficient documentation

## 2015-04-06 DIAGNOSIS — Z87828 Personal history of other (healed) physical injury and trauma: Secondary | ICD-10-CM | POA: Diagnosis not present

## 2015-04-06 DIAGNOSIS — H9201 Otalgia, right ear: Secondary | ICD-10-CM | POA: Diagnosis present

## 2015-04-06 DIAGNOSIS — H6641 Suppurative otitis media, unspecified, right ear: Secondary | ICD-10-CM | POA: Insufficient documentation

## 2015-04-06 DIAGNOSIS — M179 Osteoarthritis of knee, unspecified: Secondary | ICD-10-CM | POA: Diagnosis not present

## 2015-04-06 MED ORDER — AMOXICILLIN 500 MG PO CAPS
500.0000 mg | ORAL_CAPSULE | Freq: Once | ORAL | Status: AC
  Administered 2015-04-06: 500 mg via ORAL
  Filled 2015-04-06: qty 1

## 2015-04-06 MED ORDER — IBUPROFEN 400 MG PO TABS
800.0000 mg | ORAL_TABLET | Freq: Once | ORAL | Status: AC
  Administered 2015-04-06: 800 mg via ORAL
  Filled 2015-04-06: qty 2

## 2015-04-06 MED ORDER — AMOXICILLIN 500 MG PO CAPS: 500.0000 mg | ORAL_CAPSULE | Freq: Three times a day (TID) | ORAL | Status: AC

## 2015-04-06 NOTE — Discharge Instructions (Signed)
1. Medications: amoxicillin, ibuprofen 800mg  3x per day with food for pain control, usual home medications 2. Treatment: rest, drink plenty of fluids,  3. Follow Up: Please followup with your primary doctor in 2 days for discussion of your diagnoses and further evaluation after today's visit; if you do not have a primary care doctor use the resource guide provided to find one; Please return to the ER for worsening symptoms, fevers or other concerns    Otitis Media Otitis media is redness, soreness, and inflammation of the middle ear. Otitis media may be caused by allergies or, most commonly, by infection. Often it occurs as a complication of the common cold. SIGNS AND SYMPTOMS Symptoms of otitis media may include:  Earache.  Fever.  Ringing in your ear.  Headache.  Leakage of fluid from the ear. DIAGNOSIS To diagnose otitis media, your health care provider will examine your ear with an otoscope. This is an instrument that allows your health care provider to see into your ear in order to examine your eardrum. Your health care provider also will ask you questions about your symptoms. TREATMENT  Typically, otitis media resolves on its own within 3-5 days. Your health care provider may prescribe medicine to ease your symptoms of pain. If otitis media does not resolve within 5 days or is recurrent, your health care provider may prescribe antibiotic medicines if he or she suspects that a bacterial infection is the cause. HOME CARE INSTRUCTIONS   If you were prescribed an antibiotic medicine, finish it all even if you start to feel better.  Take medicines only as directed by your health care provider.  Keep all follow-up visits as directed by your health care provider. SEEK MEDICAL CARE IF:  You have otitis media only in one ear, or bleeding from your nose, or both.  You notice a lump on your neck.  You are not getting better in 3-5 days.  You feel worse instead of better. SEEK  IMMEDIATE MEDICAL CARE IF:   You have pain that is not controlled with medicine.  You have swelling, redness, or pain around your ear or stiffness in your neck.  You notice that part of your face is paralyzed.  You notice that the bone behind your ear (mastoid) is tender when you touch it. MAKE SURE YOU:   Understand these instructions.  Will watch your condition.  Will get help right away if you are not doing well or get worse. Document Released: 05/15/2004 Document Revised: 12/25/2013 Document Reviewed: 03/07/2013 [facility] Patient Information 2015 [place], [place]. This information is not intended to replace advice given to you by your health care provider. Make sure you discuss any questions you have with your health care provider.

## 2015-04-06 NOTE — ED Provider Notes (Signed)
CSN: [id number]     Arrival date & time 04/06/15  0007 History   First MD Initiated Contact with Patient 04/06/15 0150     Chief Complaint  Patient presents with  . Otalgia     (Consider location/radiation/quality/duration/timing/severity/associated sxs/prior Treatment) Patient is a 49 y.o. male presenting with ear pain. The history is provided by the patient and medical records. No language interpreter was used.  Otalgia Associated symptoms: headaches and sore throat   Associated symptoms: no abdominal pain, no congestion, no cough, no diarrhea, no ear discharge, no fever, no rash, no rhinorrhea and no vomiting      Ryan Zimmerman is a 49 y.o. male  with a hx of osteoarthritis, depression, chronic back pain, recurrent otitis media presents to the Emergency Department complaining of gradual, persistent, progressively worsening right antalgia onset 3-4 days ago but significantly worsening tonight. Associated symptoms include or and scratchy throat.  No treatments prior to arrival. Nothing makes it better and sound and trying to sleep makes it worse.  Pt denies fever, chills, neck pain, neck stiffness, chest pain, abdominal pain, nausea, vomiting, diarrhea.     Past Medical History  Diagnosis Date  . Osteoarthritis of knee 05/24/2012  . Depression 05/24/2012  . Erectile dysfunction 05/24/2012  . Elevated LFTs   . Chronic back pain   . Concussion   . Memory loss    Past Surgical History  Procedure Laterality Date  . None     Family History  Problem Relation Age of Onset  . Breast cancer Mother   . Colon polyps Mother   . Cancer Mother   . Arthritis Other   . Hypertension Other   . Breast cancer Maternal Aunt   . Colon cancer Neg Hx    Social History  Substance Use Topics  . Smoking status: Current Every Day Smoker -- 0.10 packs/day    Types: Cigarettes  . Smokeless tobacco: Never Used  . Alcohol Use: 0.6 oz/week    1 Cans of beer per week     Comment: social    Review of  Systems  Constitutional: Negative for fever, chills, appetite change and fatigue.  HENT: Positive for ear pain and sore throat. Negative for congestion, ear discharge, mouth sores, postnasal drip, rhinorrhea and sinus pressure.   Eyes: Negative for visual disturbance.  Respiratory: Negative for cough, chest tightness, shortness of breath, wheezing and stridor.   Cardiovascular: Negative for chest pain, palpitations and leg swelling.  Gastrointestinal: Negative for nausea, vomiting, abdominal pain and diarrhea.  Genitourinary: Negative for dysuria, urgency, frequency and hematuria.  Musculoskeletal: Negative for myalgias, back pain, arthralgias and neck stiffness.  Skin: Negative for rash.  Neurological: Positive for headaches. Negative for syncope, light-headedness and numbness.  Hematological: Negative for adenopathy.  Psychiatric/Behavioral: The patient is not nervous/anxious.   All other systems reviewed and are negative.     Allergies  Codeine  Home Medications   Prior to Admission medications   Medication Sig Start Date End Date Taking? Authorizing Provider  amoxicillin (AMOXIL) 500 MG capsule Take 1 capsule (500 mg total) by mouth 3 (three) times daily. 04/06/15   Hannah Muthersbaugh, PA-C  buPROPion (WELLBUTRIN SR) 150 MG 12 hr tablet Take 1 tablet (150 mg total) by mouth 2 (two) times daily. 07/31/13   Marcial Pacas, MD  cyclobenzaprine (FLEXERIL) 5 MG tablet Take 1 tablet (5 mg total) by mouth 3 (three) times daily as needed for muscle spasms. 05/24/12   Biagio Borg, MD  MOVIPREP 100  G SOLR  07/02/13   Historical Provider, MD  omeprazole (PRILOSEC) 40 MG capsule Take 1 capsule (40 mg total) by mouth daily. 07/03/14   Gregor Hams, MD  PredniSONE 10 MG KIT 12 day dose pack po 07/03/14   Gregor Hams, MD  traMADol (ULTRAM) 50 MG tablet Take 50 mg by mouth as needed.    Historical Provider, MD   BP 131/82 mmHg  Pulse 72  Temp(Src) 98.2 F (36.8 C) (Oral)  Resp 16  Ht 6' 3" (1.905  m)  Wt 265 lb (120.203 kg)  BMI 33.12 kg/m2  SpO2 95% Physical Exam  Constitutional: He is oriented to person, place, and time. He appears well-developed and well-nourished. No distress.  HENT:  Head: Normocephalic and atraumatic.  Right Ear: External ear and ear canal normal. Tympanic membrane is erythematous and bulging. A middle ear effusion is present.  Left Ear: Tympanic membrane, external ear and ear canal normal. Tympanic membrane is not erythematous and not bulging.  No middle ear effusion.  Nose: Nose normal. No epistaxis. Right sinus exhibits no maxillary sinus tenderness and no frontal sinus tenderness. Left sinus exhibits no maxillary sinus tenderness and no frontal sinus tenderness.  Mouth/Throat: Uvula is midline and mucous membranes are normal. Mucous membranes are not pale and not cyanotic. No oropharyngeal exudate, posterior oropharyngeal edema, posterior oropharyngeal erythema or tonsillar abscesses.  Superlative middle ear effusion of the right ear No evidence of perforation of the right TM  Eyes: Conjunctivae are normal. Pupils are equal, round, and reactive to light.  Neck: Normal range of motion and full passive range of motion without pain.  Cardiovascular: Normal rate and intact distal pulses.   Pulmonary/Chest: Effort normal and breath sounds normal. No stridor.  Clear and equal breath sounds without focal wheezes, rhonchi, rales  Abdominal: Soft. Bowel sounds are normal. There is no tenderness.  Musculoskeletal: Normal range of motion.  Lymphadenopathy:    He has no cervical adenopathy.  Neurological: He is alert and oriented to person, place, and time.  Skin: Skin is warm and dry. No rash noted. He is not diaphoretic.  Psychiatric: He has a normal mood and affect.  Nursing note and vitals reviewed.   ED Course  Procedures (including critical care time) Labs Review Labs Reviewed - No data to display  Imaging Review No results found. I, Muthersbaugh,  Jarrett Soho, personally reviewed and evaluated these images and lab results as part of my medical decision-making.   EKG Interpretation None      MDM   Final diagnoses:  Suppurative otitis media of right ear without spontaneous rupture of tympanic membrane, recurrence not specified, unspecified chronicity   Ryan Zimmerman presents with right otalgia.  Patient presents with otalgia and exam consistent with acute otitis media. No concern for acute mastoiditis, meningitis.  No antibiotic use in the last month.  Patient discharged home with Amoxicillin.  Advised patient to call PCP for f/u on Monday.  I have also discussed reasons to return immediately to the ER.  Parent expresses understanding and agrees with plan.  BP 131/82 mmHg  Pulse 72  Temp(Src) 98.2 F (36.8 C) (Oral)  Resp 16  Ht 6' 3" (1.905 m)  Wt 265 lb (120.203 kg)  BMI 33.12 kg/m2  SpO2 95%      Abigail Butts, PA-C 04/06/15 [place], DO 04/06/15 [phone]

## 2015-04-06 NOTE — ED Notes (Signed)
C/o R earache x 3-4 days.

## 2016-06-03 ENCOUNTER — Encounter: Payer: Self-pay | Admitting: Internal Medicine
